# Patient Record
Sex: Female | Born: 1950 | Race: White | Hispanic: No | Marital: Married | Smoking: Former smoker
Health system: Southern US, Community
[De-identification: ages and names within clinical notes are randomized; demographics above are authoritative.]

## PROBLEM LIST (undated history)

## (undated) DIAGNOSIS — Z9889 Other specified postprocedural states: Secondary | ICD-10-CM

## (undated) DIAGNOSIS — F32A Depression, unspecified: Secondary | ICD-10-CM

## (undated) DIAGNOSIS — M1990 Unspecified osteoarthritis, unspecified site: Secondary | ICD-10-CM

## (undated) DIAGNOSIS — R112 Nausea with vomiting, unspecified: Secondary | ICD-10-CM

## (undated) DIAGNOSIS — D229 Melanocytic nevi, unspecified: Secondary | ICD-10-CM

## (undated) DIAGNOSIS — H3530 Unspecified macular degeneration: Secondary | ICD-10-CM

## (undated) DIAGNOSIS — F419 Anxiety disorder, unspecified: Secondary | ICD-10-CM

## (undated) HISTORY — DX: Melanocytic nevi, unspecified: D22.9

## (undated) HISTORY — DX: Depression, unspecified: F32.A

## (undated) HISTORY — DX: Unspecified macular degeneration: H35.30

## (undated) HISTORY — DX: Anxiety disorder, unspecified: F41.9

## (undated) HISTORY — PX: COLONOSCOPY: SHX174

## (undated) HISTORY — PX: OTHER SURGICAL HISTORY: SHX169

## (undated) HISTORY — PX: CHOLECYSTECTOMY: SHX55

---

## 1969-01-01 DIAGNOSIS — R011 Cardiac murmur, unspecified: Secondary | ICD-10-CM

## 1969-01-01 HISTORY — DX: Cardiac murmur, unspecified: R01.1

## 1999-01-02 HISTORY — PX: HIP SURGERY: SHX245

## 2001-08-22 ENCOUNTER — Ambulatory Visit (HOSPITAL_COMMUNITY): Admission: RE | Admit: 2001-08-22 | Discharge: 2001-08-22 | Payer: Self-pay | Admitting: Internal Medicine

## 2001-09-19 ENCOUNTER — Ambulatory Visit (HOSPITAL_COMMUNITY): Admission: RE | Admit: 2001-09-19 | Discharge: 2001-09-19 | Payer: Self-pay | Admitting: Internal Medicine

## 2001-09-19 ENCOUNTER — Encounter: Payer: Self-pay | Admitting: Internal Medicine

## 2008-06-15 ENCOUNTER — Other Ambulatory Visit: Admission: RE | Admit: 2008-06-15 | Discharge: 2008-06-15 | Payer: Self-pay | Admitting: Obstetrics and Gynecology

## 2009-02-16 ENCOUNTER — Ambulatory Visit: Payer: Self-pay | Admitting: Orthopedic Surgery

## 2009-02-16 DIAGNOSIS — M7580 Other shoulder lesions, unspecified shoulder: Secondary | ICD-10-CM

## 2009-02-16 DIAGNOSIS — M25519 Pain in unspecified shoulder: Secondary | ICD-10-CM

## 2009-05-17 ENCOUNTER — Ambulatory Visit (HOSPITAL_COMMUNITY): Admission: RE | Admit: 2009-05-17 | Discharge: 2009-05-17 | Payer: Self-pay | Admitting: Obstetrics and Gynecology

## 2010-01-31 NOTE — Letter (Signed)
Summary: History form  History form   Imported By: Jacklynn Ganong 02/17/2009 08:25:48  _____________________________________________________________________  External Attachment:    Type:   Image     Comment:   External Document

## 2010-01-31 NOTE — Assessment & Plan Note (Signed)
Summary: LEFT SHOULDER PAIN NEEDS XR/AETNA/FAGAN/BSF   Vital Signs:  Patient profile:   60 year old female Weight:      150 pounds Pulse rate:   78 / minute Resp:     16 per minute  Vitals Entered By: Fuller Canada MD (February 16, 2009 10:41 AM)  Visit Type:  Initial Consult Referring Provider:  Dr. Ouida Sills Primary Provider:  Dr. Ouida Sills  CC:  left shoulder pain.  History of Present Illness: I saw Hannah Contreras in the office today for an initial visit.  She is a 60 years old woman with the complaint of:  left shoulder pain some neck pain also.  No major injury was noted but increasing pain in the LEFT shoulder difficulty sleeping on the LEFT side associated with certain movements with the arm away from the body causes increased pain.  Pain has now progressed to include neck pain with some neck stiffness and the patient wanted this checked out.  She is averse to taking injection or medication if not needed  She did try some Motrin without much relief aircraft    Will have xrays today in our office.  Motrin for pain not much relief.    Allergies (verified): 1)  ! Pcn  Past History:  Past Medical History: anxiety  Past Surgical History: gallbladder 2 c sections OTIF right hip Dr Romeo Apple   Family History: Family History of Diabetes Family History Coronary Heart Disease female < 23 Family History of Arthritis  Social History: Patient is married.  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 Cardiac :  Denies chest pain, angina, heart attack, heart failure, poor circulation, blood clots, and phlebitis; murmur. Resp:  Denies short of breath, difficulty breathing, COPD, cough, and pneumonia. GI:  Denies nausea, vomiting, diarrhea, constipation, difficulty swallowing, ulcers, GERD, and reflux. GU:  Denies  kidney failure, kidney transplant, kidney stones, burning, poor stream, testicular cancer, blood in urine, and . Neuro:  Denies headache, dizziness, migraines, numbness, weakness, tremor, and unsteady walking. MS:  Denies joint pain, rheumatoid arthritis, joint swelling, gout, bone cancer, osteoporosis, and ; muscle pain. Endo:  Denies thyroid disease, goiter, and diabetes. Psych:  Complains of anxiety; denies depression, mood swings, panic attack, bipolar, and schizophrenia. Derm:  Denies eczema, cancer, and itching. EENT:  Denies poor vision, cataracts, glaucoma, poor hearing, vertigo, ears ringing, sinusitis, hoarseness, toothaches, and bleeding gums. Immunology:  Denies seasonal allergies, sinus problems, and allergic to bee stings. Lymphatic:  Denies lymph node cancer and lymph edema.  Physical Exam  Additional Exam:  GEN: well developed and nourished. normal body habitus, grooming and hygiene  CDV: normal pulses perfusion color and temperature without swelling or edema  Lymph: normal lymph system  SKIN: normal without lesions, masses, nodules  NEURO/PSYCH: Normal coordination, reflexes and sensation. awake alert and oriented   cervical spine inspection was normal except for tenderness on the LEFT side of the cervical spine at the base of the cervical spine she did have decreased range of motion with rotation to the LEFT and lateral bend to the RIGHT with increased pain along the side of the neck.  No laxity was noted and muscle tone was normal  Skin over the neck was normal as was the upper back and trunk  RIGHT shoulder inspection was normal there was no tenderness.  Her range  of motion is full.  There is no joint laxity and she has no atrophy or tremor in her muscle strength and tone were normal  LEFT shoulder she had really good flexion external rotation internal rotation to T4 but she does have some pain with internal rotation of the arm in forward elevation and her impingement  sign while normal with a Neer maneuver is positive with Hawkins maneuver.  Should no instability or it muscle tone was normal no tremor.  No joint laxity.  Skin normal in both shoulders.     Impression & Recommendations:  Problem # 1:  SHOULDER PAIN (ICD-719.41) Assessment New  2 views were done of the LEFT shoulder  2 views of the glenohumeral joint   The glenohumeral joint space is normal. The bony anatomy is without bone lesion. The acromion is a Type II  Orders: Consultation Level III (16109) Shoulder x-ray,  minimum 2 views (60454)  Problem # 2:  IMPINGEMENT SYNDROME (ICD-726.2) Assessment: New  Orders: Consultation Level III (09811) Shoulder x-ray,  minimum 2 views (91478)  Patient Instructions: 1)  Limit activity to comfort and avoid activities that increase discomfort.  Apply moist heat and/or ice to shoulder and take medication as instructed for pain relief. Please read the Shoulder Pain Handout and start exercises   2)  Please schedule a follow-up appointment as needed.

## 2010-05-19 NOTE — Op Note (Signed)
   NAME:  Hannah Contreras, Hannah Contreras                        ACCOUNT NO.:  [id number]   MEDICAL RECORD NO.:  [id number]                   PATIENT TYPE:  AMB   LOCATION:  DAY                                  FACILITY:  [facility]   PHYSICIAN:  Gerrit Friends. Rourk, M.D.               DATE OF BIRTH:  Dec 27, 1950   DATE OF PROCEDURE:  08/22/2001  DATE OF DISCHARGE:                                 OPERATIVE REPORT   PROCEDURE:  Screening colonoscopy.   ENDOSCOPIST:  Gerrit Friends. Rourk, M.D.   INDICATIONS FOR PROCEDURE:  The patient is a 60 year old lady referred for  colorectal cancer screening by Dr. Ouida Sills.  She is devoid of any lower GI  tract symptoms.  No family history of colorectal neoplasia.  She has never  had her lower GI tract imaged.  Colonoscopy is now being done as a standard  screening maneuver.   Dictation ended at this point.                                               Gerrit Friends. Rourk, M.D.    RMR/MEDQ  D:  08/22/2001  T:  08/23/2001  Job:  [id number]

## 2010-05-19 NOTE — Op Note (Signed)
   NAME:  Hannah Contreras, Hannah Contreras                        ACCOUNT NO.:  [id number]   MEDICAL RECORD NO.:  [id number]                   PATIENT TYPE:  AMB   LOCATION:  DAY                                  FACILITY:  [facility]   PHYSICIAN:  Gerrit Friends. Rourk, M.D.               DATE OF BIRTH:  09-09-1950   DATE OF PROCEDURE:  08/22/2001  DATE OF DISCHARGE:                                 OPERATIVE REPORT   PROCEDURE:  Screening colonoscopy.   ENDOSCOPIST:  Gerrit Friends. Rourk, M.D.   INDICATIONS FOR PROCEDURE:  The patient is a 60 year old lady with no GI  symptoms. She is referred at the request of Dr. Carylon Perches for screening  colonoscopy.  She has never had her lower GI tract imaged.  There is no  family history of colorectal neoplasia.  Colonoscopy is now being done as a  standard screening maneuver.  This approach has been discussed with the  patient at length at the bedside.  The potential risks, benefits, and  alternatives have been reviewed, questions answered.  I feel that she is low  risk for conscious sedation in the way of Versed and Demerol.   DESCRIPTION OF PROCEDURE:  The patient is placed in the left lateral  decubitus position.  O2 saturation, blood pressure, pulse and respirations  were monitored throughout the entire procedure.   CONSCIOUS SEDATION:  Versed 6 mg IV, Fentanyl 125 mcg IV in divided doses.   INSTRUMENT:  Olympus video chip adult colonoscope.   FINDINGS:  Digital rectal exam revealed no abnormalities.   ENDOSCOPIC FINDINGS:  The prep was good.   RECTUM:  Examination of the rectal mucosa including the retroflex view of  the anal verge revealed no abnormalities.   COLON:  The colonic mucosa was surveyed from the rectosigmoid junction  through the left transverse and right colon to the area of the appendiceal  orifice, ileocecal valve, and cecum.  These structures were well seen and  photographed for the record.  The colonic mucosa all the way to the cecum  appeared  normal.   From the level of the cecum and ileocecal valve the scope was slowly  withdrawn; and all previously mentioned mucosal surfaces were one again  seen; and, again, no abnormalities were observed.  The patient tolerated the  procedure well and was reacted in endoscopy.   IMPRESSION:  1. Normal rectum.  2. Normal colon.    RECOMMENDATIONS:  1. Follow up with Dr. Ouida Sills.  2. Repeat colonoscopy in 10 years.                                               Gerrit Friends. Rourk, M.D.    RMR/MEDQ  D:  08/22/2001  T:  08/23/2001  Job:  [id number]

## 2011-04-24 ENCOUNTER — Other Ambulatory Visit: Payer: Self-pay | Admitting: Obstetrics and Gynecology

## 2011-04-24 ENCOUNTER — Other Ambulatory Visit (HOSPITAL_COMMUNITY)
Admission: RE | Admit: 2011-04-24 | Discharge: 2011-04-24 | Disposition: A | Discharge status: Home / Self Care | Payer: BC Managed Care – PPO | Source: Ambulatory Visit | Attending: Obstetrics and Gynecology | Admitting: Obstetrics and Gynecology

## 2011-04-24 DIAGNOSIS — Z01419 Encounter for gynecological examination (general) (routine) without abnormal findings: Secondary | ICD-10-CM | POA: Insufficient documentation

## 2012-04-25 ENCOUNTER — Other Ambulatory Visit: Payer: Self-pay | Admitting: Obstetrics and Gynecology

## 2012-04-25 DIAGNOSIS — Z139 Encounter for screening, unspecified: Secondary | ICD-10-CM

## 2012-05-19 ENCOUNTER — Ambulatory Visit (HOSPITAL_COMMUNITY)
Admission: RE | Admit: 2012-05-19 | Discharge: 2012-05-19 | Disposition: A | Discharge status: Home / Self Care | Payer: BC Managed Care – PPO | Source: Ambulatory Visit | Attending: Obstetrics and Gynecology | Admitting: Obstetrics and Gynecology

## 2012-05-19 DIAGNOSIS — Z1231 Encounter for screening mammogram for malignant neoplasm of breast: Secondary | ICD-10-CM | POA: Insufficient documentation

## 2012-05-19 DIAGNOSIS — Z139 Encounter for screening, unspecified: Secondary | ICD-10-CM

## 2013-06-29 ENCOUNTER — Other Ambulatory Visit: Payer: Self-pay | Admitting: Obstetrics and Gynecology

## 2013-06-29 DIAGNOSIS — Z1231 Encounter for screening mammogram for malignant neoplasm of breast: Secondary | ICD-10-CM

## 2013-07-06 ENCOUNTER — Ambulatory Visit (HOSPITAL_COMMUNITY)
Admission: RE | Admit: 2013-07-06 | Discharge: 2013-07-06 | Disposition: A | Discharge status: Home / Self Care | Payer: BC Managed Care – PPO | Source: Ambulatory Visit | Attending: Obstetrics and Gynecology | Admitting: Obstetrics and Gynecology

## 2013-07-06 DIAGNOSIS — Z1231 Encounter for screening mammogram for malignant neoplasm of breast: Secondary | ICD-10-CM | POA: Insufficient documentation

## 2013-07-31 ENCOUNTER — Emergency Department (HOSPITAL_COMMUNITY)
Admission: EM | Admit: 2013-07-31 | Discharge: 2013-07-31 | Disposition: A | Discharge status: Home / Self Care | Payer: BC Managed Care – PPO | Attending: Emergency Medicine | Admitting: Emergency Medicine

## 2013-07-31 ENCOUNTER — Encounter (HOSPITAL_COMMUNITY): Payer: Self-pay | Admitting: Emergency Medicine

## 2013-07-31 ENCOUNTER — Emergency Department (HOSPITAL_COMMUNITY): Payer: BC Managed Care – PPO

## 2013-07-31 DIAGNOSIS — Y9389 Activity, other specified: Secondary | ICD-10-CM | POA: Insufficient documentation

## 2013-07-31 DIAGNOSIS — S42213A Unspecified displaced fracture of surgical neck of unspecified humerus, initial encounter for closed fracture: Secondary | ICD-10-CM | POA: Insufficient documentation

## 2013-07-31 DIAGNOSIS — Y929 Unspecified place or not applicable: Secondary | ICD-10-CM | POA: Insufficient documentation

## 2013-07-31 DIAGNOSIS — S42212A Unspecified displaced fracture of surgical neck of left humerus, initial encounter for closed fracture: Secondary | ICD-10-CM

## 2013-07-31 DIAGNOSIS — Z880 Allergy status to penicillin: Secondary | ICD-10-CM | POA: Insufficient documentation

## 2013-07-31 DIAGNOSIS — R296 Repeated falls: Secondary | ICD-10-CM | POA: Insufficient documentation

## 2013-07-31 DIAGNOSIS — S6990XA Unspecified injury of unspecified wrist, hand and finger(s), initial encounter: Secondary | ICD-10-CM | POA: Insufficient documentation

## 2013-07-31 MED ORDER — OXYCODONE-ACETAMINOPHEN 5-325 MG PO TABS
1.0000 | ORAL_TABLET | Freq: Once | ORAL | Status: AC
  Administered 2013-07-31: 1 via ORAL
  Filled 2013-07-31: qty 1

## 2013-07-31 MED ORDER — OXYCODONE-ACETAMINOPHEN 5-325 MG PO TABS: ORAL_TABLET | ORAL | Status: DC

## 2013-07-31 MED ORDER — DICLOFENAC SODIUM 75 MG PO TBEC: 75.0000 mg | DELAYED_RELEASE_TABLET | Freq: Two times a day (BID) | ORAL | Status: DC

## 2013-07-31 NOTE — ED Notes (Signed)
Fell this morning.  Tangled up in dog leash.  C/o pain to left upper arm/shoulder.    Abrasions to both knees.

## 2013-07-31 NOTE — Discharge Instructions (Signed)
Your x-ray reveals a fracture of the left humerus. Please apply ice, and use the shoulder immobilizer until seen by Dr. Aline Brochure. Please call Dr. Aline Brochure on Monday to arrange an office appointment. Please use diclofenac 2 times daily with food until all taken. May use Percocet for pain if needed. This medication may cause drowsiness, and/or constipation. Please use with caution. Humerus Fracture, Treated with Immobilization The humerus is the large bone in the upper arm. A broken (fractured) humerus is often treated by wearing a cast, splint, or sling (immobilization). This holds the broken pieces in place so they can heal.  HOME CARE  Put ice on the injured area.  Put ice in a plastic bag.  Place a towel between your skin and the bag.  Leave the ice on for 15-20 minutes, 03-04 times a day.  If you are given a cast:  Do not scratch the skin under the cast.  Check the skin around the cast every day. You may put lotion on any red or sore areas.  Keep the cast dry and clean.  If you are given a splint:  Wear the splint as told.  Keep the splint clean and dry.  Loosen the elastic around the splint if your fingers become numb, cold, tingle, or turn blue.  If you are given a sling:  Wear the sling as told.  Do not put pressure on any part of the cast or splint until it is fully hardened.  The cast or splint must be protected with a plastic bag during bathing. Do not lower the cast or splint into water.  Only take medicine as told by your doctor.  Do exercises as told by your doctor.  Follow up as told by your doctor. GET HELP RIGHT AWAY IF:   Your skin or fingernails turn blue or gray.  Your arm feels cold or numb.  You have very bad pain in the injured arm.  You are having problems with the medicines you were given. MAKE SURE YOU:   Understand these instructions.  Will watch your condition.  Will get help right away if you are not doing well or get  worse. Document Released: 06/06/2007 Document Revised: 03/12/2011 Document Reviewed: 02/01/2010 [facility] Patient Information 2015 [place], [place]. This information is not intended to replace advice given to you by your health care provider. Make sure you discuss any questions you have with your health care provider.

## 2013-07-31 NOTE — ED Provider Notes (Signed)
CSN: [id number]     Arrival date & time 07/31/13  1103 History   First MD Initiated Contact with Patient 07/31/13 1106     Chief Complaint  Patient presents with  . Fall     (Consider location/radiation/quality/duration/timing/severity/associated sxs/prior Treatment) Patient is a 63 y.o. female presenting with fall. The history is provided by the patient.  Fall This is a new problem. The current episode started today. The problem has been gradually worsening. Associated symptoms include arthralgias. Pertinent negatives include no abdominal pain, chest pain, coughing, fever, nausea, neck pain or vomiting. Exacerbated by: movement of the left shoulder. She has tried nothing for the symptoms. The treatment provided no relief.    History reviewed. No pertinent past medical history. Past Surgical History  Procedure Laterality Date  . Cesarean section    . Cholecystectomy    . Other surgical history      history of multiple fractures.  right hip., left arm and elbow, right arm.    History reviewed. No pertinent family history. History  Substance Use Topics  . Smoking status: Never Smoker   . Smokeless tobacco: Not on file  . Alcohol Use: No   OB History   Grav Para Term Preterm Abortions TAB SAB Ect Mult Living                 Review of Systems  Constitutional: Negative for fever and activity change.       All ROS Neg except as noted in HPI  Eyes: Negative for photophobia and discharge.  Respiratory: Negative for cough, shortness of breath and wheezing.   Cardiovascular: Negative for chest pain and palpitations.  Gastrointestinal: Negative for nausea, vomiting, abdominal pain and blood in stool.  Genitourinary: Negative for dysuria, frequency and hematuria.  Musculoskeletal: Positive for arthralgias. Negative for back pain and neck pain.  Skin: Negative.   Neurological: Negative for dizziness, seizures and speech difficulty.  Psychiatric/Behavioral: Negative for hallucinations  and confusion.      Allergies  Codeine and Penicillins  Home Medications   Prior to Admission medications   Medication Sig Start Date End Date Taking? Authorizing Provider  Dietary Management Product (TOZAL PO) Take 3 tablets by mouth daily.   Yes Historical Provider, MD   BP 118/62  Pulse 75  Temp(Src) 98.1 F (36.7 C) (Oral)  Resp 20  SpO2 99% Physical Exam  Nursing note and vitals reviewed. Constitutional: She is oriented to person, place, and time. She appears well-developed and well-nourished.  Non-toxic appearance.  HENT:  Head: Normocephalic.  Right Ear: Tympanic membrane and external ear normal.  Left Ear: Tympanic membrane and external ear normal.  Eyes: EOM and lids are normal. Pupils are equal, round, and reactive to light.  Neck: Normal range of motion. Neck supple. Carotid bruit is not present.  Cardiovascular: Normal rate, regular rhythm, normal heart sounds, intact distal pulses and normal pulses.  Exam reveals no gallop and no friction rub.   No murmur heard. Pulmonary/Chest: Breath sounds normal. No respiratory distress. She has no wheezes. She has no rales. She exhibits no tenderness.  Abdominal: Soft. Bowel sounds are normal. There is no tenderness. There is no guarding.  Musculoskeletal: Normal range of motion.  Capillary refill on the left is less than 2 seconds. The radial pulse is 2+. The brachial pulse is 2+. There is no deformity or swelling involving the elbow. There is no pain to palpation of the biceps triceps area. There is pain with attempted raising of the left  arm. There is no evidence for dislocation. There is no deformity of the scapula or clavical.  Lymphadenopathy:       Head (right side): No submandibular adenopathy present.       Head (left side): No submandibular adenopathy present.    She has no cervical adenopathy.  Neurological: She is alert and oriented to person, place, and time. She has normal strength. No cranial nerve deficit or  sensory deficit.  Grip is symmetrical. No sensory deficits of the upper extremities appreciated.  Skin: Skin is warm and dry.  Psychiatric: She has a normal mood and affect. Her speech is normal.    ED Course  Procedures (including critical care time) Labs Review Labs Reviewed - No data to display  Imaging Review No results found.   EKG Interpretation None      MDM X-ray of the left shoulder is negative for fracture or dislocation. 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  X-ray of the left humerus reveals a mildly impacted fracture of the neck of the left humerus. The shaft of the humerus is intact. No elbow abnormality.  The patient has been made aware of the findings on the x-rays. The patient has been fitted with a shoulder immobilizer, ice pack, and pain medications. Call placed to Dr. Aline Brochure at the patient's request. Dr. Aline Brochure as not on call today. Patient will notify the physician on Monday, and arrange office evaluation. Prescription for Percocet one every 6 hours given to the patient.    Final diagnoses:  None    *I have reviewed nursing notes, vital signs, and all appropriate lab and imaging results for this patient.Lenox Ahr, PA-C 08/01/13 0930

## 2013-08-01 NOTE — ED Provider Notes (Signed)
Medical screening examination/treatment/procedure(s) were performed by non-physician practitioner and as supervising physician I was immediately available for consultation/collaboration.   EKG Interpretation None       Orlie Dakin, MD 08/01/13 1735

## 2013-08-04 ENCOUNTER — Ambulatory Visit (INDEPENDENT_AMBULATORY_CARE_PROVIDER_SITE_OTHER): Payer: BC Managed Care – PPO | Admitting: Orthopedic Surgery

## 2013-08-04 ENCOUNTER — Encounter: Payer: Self-pay | Admitting: Orthopedic Surgery

## 2013-08-04 VITALS — BP 103/60 | Ht 64.0 in | Wt 160.0 lb

## 2013-08-04 DIAGNOSIS — S42209A Unspecified fracture of upper end of unspecified humerus, initial encounter for closed fracture: Secondary | ICD-10-CM

## 2013-08-04 DIAGNOSIS — S42202A Unspecified fracture of upper end of left humerus, initial encounter for closed fracture: Secondary | ICD-10-CM

## 2013-08-04 NOTE — Patient Instructions (Addendum)
Sling wear 2 weeks   Return for xrays   Humerus Fracture, Treated with Immobilization The humerus is the large bone in your upper arm. You have a broken (fractured) humerus. These fractures are easily diagnosed with X-rays. TREATMENT  Simple fractures which will heal without disability are treated with simple immobilization. Immobilization means you will wear a cast, splint, or sling. You have a fracture which will do well with immobilization. The fracture will heal well simply by being held in a good position until it is stable enough to begin range of motion exercises. Do not take part in activities which would further injure your arm.  HOME CARE INSTRUCTIONS   Put ice on the injured area.  Put ice in a plastic bag.  Place a towel between your skin and the bag.  Leave the ice on for 15-20 minutes, 03-04 times a day.  If you have a cast:  Do not scratch the skin under the cast using sharp or pointed objects.  Check the skin around the cast every day. You may put lotion on any red or sore areas.  Keep your cast dry and clean.  If you have a splint:  Wear the splint as directed.  Keep your splint dry and clean.  You may loosen the elastic around the splint if your fingers become numb, tingle, or turn cold or blue.  If you have a sling:  Wear the sling as directed.  Do not put pressure on any part of your cast or splint until it is fully hardened.  Your cast or splint can be protected during bathing with a plastic bag. Do not lower the cast or splint into water.  Only take over-the-counter or prescription medicines for pain, discomfort, or fever as directed by your caregiver.  Do range of motion exercises as instructed by your caregiver.  Follow up as directed by your caregiver. This is very important in order to avoid permanent injury or disability and chronic pain. SEEK IMMEDIATE MEDICAL CARE IF:   Your skin or nails in the injured arm turn blue or gray.  Your arm  feels cold or numb.  You develop severe pain in the injured arm.  You are having problems with the medicines you were given. MAKE SURE YOU:   Understand these instructions.  Will watch your condition.  Will get help right away if you are not doing well or get worse. Document Released: 03/26/2000 Document Revised: 03/12/2011 Document Reviewed: 02/01/2010 [facility] Patient Information 2015 [place], [place]. This information is not intended to replace advice given to you by your health care provider. Make sure you discuss any questions you have with your health care provider.

## 2013-08-04 NOTE — Progress Notes (Signed)
The patient  Hannah Contreras  63 year old female who I placed cannulated screws in her hip for femoral neck Hannah Contreras plan 1 presents after falling after becoming entangled in the dog leash on July 31 she injured her proximal humerus she sustained a proximal humerus Hannah Contreras she presents with pain and swelling which is best described as sharp burning and stabbing with mild radiation. She is on diclofenac oxycodone but she's not taking the oxycodone because it makes her feel funny she. She was placed in an immobilizer and doing well with that  The past, family history and social history have been reviewed and are recorded in the corresponding sections of epic   Review of systems has been recorded reviewed and signed and scanned into the chart   Vital signs are stable as recorded  General appearance is normal, body habitus normal  The patient is alert and oriented x 3  The patient's mood and affect are normal  Gait assessment: Normal  The cardiovascular exam reveals normal pulses and temperature without edema or  swelling.  The lymphatic system is negative for palpable lymph nodes  The sensory exam is normal.  There are no pathologic reflexes.  Balance is normal.   Exam of the left upper extremity  Inspection ecchymosis in the brachial area and tenderness in the proximal humerus Range of motion could not assess because of pain and Hannah Contreras Stability could not assess because of pain Strength normal muscle tone Skin normal, no rash, or laceration. Provocative tests none necessary  A/P Left proximal humerus Hannah Contreras nondisplaced hospital x-rays were excellent  Recommend shoulder immobilizer for a period of up to 14 days then repeat x-ray and start therapy after.  August 13 followup x-rays left shoulder

## 2013-08-13 ENCOUNTER — Ambulatory Visit (INDEPENDENT_AMBULATORY_CARE_PROVIDER_SITE_OTHER): Payer: Self-pay | Admitting: Orthopedic Surgery

## 2013-08-13 ENCOUNTER — Ambulatory Visit (INDEPENDENT_AMBULATORY_CARE_PROVIDER_SITE_OTHER): Payer: BC Managed Care – PPO

## 2013-08-13 VITALS — BP 108/66 | Ht 64.0 in | Wt 160.0 lb

## 2013-08-13 DIAGNOSIS — S4292XD Fracture of left shoulder girdle, part unspecified, subsequent encounter for fracture with routine healing: Secondary | ICD-10-CM

## 2013-08-13 DIAGNOSIS — S42309D Unspecified fracture of shaft of humerus, unspecified arm, subsequent encounter for fracture with routine healing: Secondary | ICD-10-CM

## 2013-08-13 NOTE — Progress Notes (Signed)
Chief Complaint  Patient presents with  . Follow-up    10 day recheck and xray Left shoulder, DOI 07/31/13   Proximal humerus fracture left shoulder post 2 weeks x-ray shows no change in position of the stable fracture she is allowed to start physical therapy continue sling for 2 more weeks  Followup 6-8 weeks for a recheck for her range of motion  Encounter Diagnosis  Name Primary?  . Shoulder fracture, left, with routine healing, subsequent encounter Yes

## 2013-08-13 NOTE — Patient Instructions (Signed)
Call to arrange therapy 

## 2013-08-27 ENCOUNTER — Ambulatory Visit (HOSPITAL_COMMUNITY)
Admission: RE | Admit: 2013-08-27 | Discharge: 2013-08-27 | Disposition: A | Discharge status: Home / Self Care | Payer: BC Managed Care – PPO | Source: Ambulatory Visit | Attending: Orthopedic Surgery | Admitting: Orthopedic Surgery

## 2013-08-27 DIAGNOSIS — IMO0001 Reserved for inherently not codable concepts without codable children: Secondary | ICD-10-CM | POA: Diagnosis not present

## 2013-08-27 DIAGNOSIS — M6281 Muscle weakness (generalized): Secondary | ICD-10-CM

## 2013-08-27 DIAGNOSIS — M25619 Stiffness of unspecified shoulder, not elsewhere classified: Secondary | ICD-10-CM

## 2013-08-27 DIAGNOSIS — M259 Joint disorder, unspecified: Secondary | ICD-10-CM | POA: Insufficient documentation

## 2013-08-27 NOTE — Evaluation (Signed)
Occupational Therapy Evaluation  Patient Details  Name: Hannah Contreras MRN: [id number] Date of Birth: 23-May-1950  Today's Date: 08/27/2013 Time: 0998-3382 OT Time Calculation (min): 30 min OT eval 5053-9767 18' [place] 3419-3790 12'  Visit#: 1 of 16  Re-eval: 09/24/13  Assessment Diagnosis: Left proximal humerus fracture Next MD Visit: 09/24/13 - Aline Brochure    Past Medical History: No past medical history on file. Past Surgical History:  Past Surgical History  Procedure Laterality Date  . Cesarean section    . Cholecystectomy    . Other surgical history      history of multiple fractures.  right hip., left arm and elbow, right arm.   . Hip surgery Right 2001    Subjective Symptoms/Limitations Symptoms: S: I'm scared to move my arm because when I was in a sling for 2 weeks I was told that if the bone shifted and wasn't healing right I would need surgery. Pertinent History: 07/31/13 patient became tangled up in daughter's dog's leash and fell down resulting in a left proximal humerus fx. No surgery was completed. Patient is to wear sling for 2 more weeks although she presents to OT evaluation without it. Pt states that she does not like to wear it even though she knows she's suppose to. Dr. Aline Brochure has referred patient to occupational therapy for evaluation and treatment. Limitations: Reaching up high, lifting heavy items Special Tests: FOTO score: 52/100 Patient Stated Goals: To heal quickly and be able to lift heavier things than a glass of water. Pain Assessment Currently in Pain?: No/denies (dull ache when she does a lot of activities with right arm)  Precautions/Restrictions  Precautions Precautions: Shoulder (progress at tolerated)  Balance Screening Balance Screen Has the patient fallen in the past 6 months: Yes How many times?: 1 Has the patient had a decrease in activity level because of a fear of falling? : Yes Is the patient reluctant to leave their home because  of a fear of falling? : No  Prior [place] expects to be discharged to:: Private residence Prior Function Level of Independence: Independent with basic ADLs;Independent with gait  Able to Take Stairs?: Yes Driving: Yes Vocation: Retired Leisure: Hobbies-yes (Comment) Comments: has 4 grandchildren, travel, she's always on the go  Assessment ADL/Vision/Perception ADL ADL Comments: Difficulty reaching up high, lifting heavy items, using left arm as dominant extremity, getting dressed Dominant Hand: Left Vision - History Baseline Vision: No visual deficits  Cognition/Observation Cognition Overall Cognitive Status: Within Functional Limits for tasks assessed Arousal/Alertness: Awake/alert Orientation Level: Oriented X4  Sensation/Coordination/Edema Edema Edema: Mild edema in left elbow to upper arm region.  Additional Assessments RUE Strength RUE Overall Strength:  (Not tested this date) LUE Assessment LUE Assessment:  (assessed supine. IR/ER Adducted) LUE AROM (degrees) Left Shoulder Flexion: 130 Degrees Left Shoulder ABduction: 86 Degrees Left Shoulder Internal Rotation: 90 Degrees Left Shoulder External Rotation: 60 Degrees Palpation Palpation: Mod fascial restrictions in left elbow, upper arm, trapezius, and scapularis region.     Exercise/Treatments     Manual Therapy Manual Therapy: Myofascial release Edema Management: Edema massage to left elbow to upper arm region to decrease edema and pain and increase joint mobility. Myofascial Release: Myofascial release to left elbow, upper arm, trapezius and scapularis region to decrease fascial restriction and increase joint mobility in a pain free zone.  Occupational Therapy Assessment and Plan OT Assessment and Plan Clinical Impression Statement: A: Patient is a 63 y/o female s/p left proximal humerus fx  resulting in decreased strength and joint mobility and increased pain and fascial  restrictions causing difficulty completing ADL and leisure tasks.  Pt will benefit from skilled therapeutic intervention in order to improve on the following deficits: Pain;Increased fascial restricitons;Decreased strength;Increased edema;Decreased range of motion Rehab Potential: Excellent OT Frequency: Min 2X/week OT Duration: 8 weeks OT Treatment/Interventions: Self-care/ADL training;Modalities;Therapeutic exercise;Therapeutic activities;Manual therapy;Patient/family education OT Plan: P: Pt will benefit from skilled OT interventions in order to decrease pain, increase ROM, increase strength, and increase overall LUE functional use. Treatment Plan: PROM, AAROM, and AROM, MFR and manual stretching, scapular strengtheing and proximal stabilization, LUE general strengthening    Goals Short Term Goals Time to Complete Short Term Goals: 4 weeks Short Term Goal 1: Patient will be educated on HEP. Short Term Goal 2: Patient will increase AROM to [facility] to increase ability to donn shirt as she did prior to fracture and with less difficulty Short Term Goal 3: Patient will increase shoulder strength to 3+/5 to increase ability to reach into overhead cabinets with less difficulty. Short Term Goal 4: Patient will decrease fascial restrictions from mod to min amount.  Long Term Goals Time to Complete Long Term Goals: 8 weeks Long Term Goal 1: Patient will return to highest level of independence with all BADL and leisure tasks. Long Term Goal 2: Patient will increase AROM to WNL to increase ability to donn shirt as she did prior to fracture and with less difficulty. Long Term Goal 3: Patient will increase shoulder strength to 4/5 to increase ability to lift heavy items with less difficulty Long Term Goal 4: Patient will report not pain or discomfort in left shoulder while completing daily tasks.  Long Term Goal 5: Patient will decrease fascial restrictions from min to zero amount.   Problem List Patient  Active Problem List   Diagnosis Date Noted  . Muscle weakness (generalized) 08/27/2013  . Decreased range of motion (ROM) of shoulder 08/27/2013  . Fracture of proximal end of left humerus 08/04/2013  . SHOULDER PAIN 02/16/2009  . IMPINGEMENT SYNDROME 02/16/2009    End of Session Activity Tolerance: Patient tolerated treatment well General Behavior During Therapy: [facility] for tasks assessed/performed OT Plan of Care OT Home Exercise Plan: towel slides OT Patient Instructions: handout (scanned) Consulted and Agree with Plan of Care: Patient   Ailene Ravel, OTR/L,CBIS   08/27/2013, 3:49 PM  Physician Documentation Your signature is required to indicate approval of the treatment plan as stated above.  Please sign and either send electronically or make a copy of this report for your files and return this physician signed original.  Please mark one 1.__approve of plan  2. ___approve of plan with the following conditions.   ______________________________                                                          _____________________ Physician Signature  Date  

## 2013-08-28 ENCOUNTER — Telehealth (HOSPITAL_COMMUNITY): Payer: Self-pay

## 2013-08-31 ENCOUNTER — Ambulatory Visit (HOSPITAL_COMMUNITY): Payer: BC Managed Care – PPO

## 2013-09-08 ENCOUNTER — Ambulatory Visit (HOSPITAL_COMMUNITY)
Admission: RE | Admit: 2013-09-08 | Discharge: 2013-09-08 | Disposition: A | Discharge status: Home / Self Care | Payer: BC Managed Care – PPO | Source: Ambulatory Visit | Attending: Orthopedic Surgery | Admitting: Orthopedic Surgery

## 2013-09-08 DIAGNOSIS — IMO0001 Reserved for inherently not codable concepts without codable children: Secondary | ICD-10-CM | POA: Insufficient documentation

## 2013-09-08 DIAGNOSIS — M6281 Muscle weakness (generalized): Secondary | ICD-10-CM | POA: Diagnosis not present

## 2013-09-08 DIAGNOSIS — M259 Joint disorder, unspecified: Secondary | ICD-10-CM | POA: Diagnosis present

## 2013-09-08 NOTE — Progress Notes (Signed)
Occupational Therapy Treatment Patient Details  Name: Hannah Contreras MRN: [id number] Date of Birth: 23-Sep-1950  Today's Date: 09/08/2013 Time: 4259-5638 OT Time Calculation (min): 42 min Manual therapy [phone] 27' Therapeutic exercises 1002-1017 15'  Visit#: 2 of 16  Re-eval: 09/24/13    Authorization:    Authorization Time Period:    Authorization Visit#:   of    Subjective S:  I have felt a feeling like a rubberband going over a tennis ball near the fracture sight a few times this weekend.  Limitations: Reaching up high, lifting heavy items Pain Assessment Currently in Pain?: Yes Pain Score: 3  Pain Location: Shoulder Pain Orientation: Left Pain Type: Acute pain Pain Onset: 1 to 4 weeks ago Pain Frequency: Occasional  Precautions/Restrictions   progress as tolerated  Exercise/Treatments Supine Protraction: PROM;10 reps Horizontal ABduction: PROM;10 reps External Rotation: PROM;10 reps Internal Rotation: PROM;10 reps Flexion: PROM;10 reps ABduction: PROM;10 reps Seated Elevation: AROM;10 reps Extension: AROM;10 reps Row: AROM;10 reps Therapy Ball Flexion: 10 reps ABduction: 10 reps  Isometric Strengthening   Flexion: Supine;3X3" Extension: Supine;3X3" External Rotation: Supine;3X3" Internal Rotation: Supine;3X3" ABduction: Supine;3X3" ADduction: Supine;3X3"    Manual Therapy Manual Therapy: Myofascial release Myofascial Release: Myofascial release to left elbow, upper arm, trapezius and scapularis region to decrease fascial restriction and increase joint mobility in a pain free zone.  Occupational Therapy Assessment and Plan OT Assessment and Plan Clinical Impression Statement: A:  PROM at 3/4 range in supine this date.   OT Plan: P:  Increase PROM to [facility] in supine, increase contraction time of isometrics to 5".   Goals Short Term Goals Time to Complete Short Term Goals: 4 weeks Short Term Goal 1: Patient will be educated on HEP. Short Term Goal  1 Progress: Progressing toward goal Short Term Goal 2: Patient will increase AROM to [facility] to increase ability to donn shirt as she did prior to fracture and with less difficulty Short Term Goal 2 Progress: Progressing toward goal Short Term Goal 3: Patient will increase shoulder strength to 3+/5 to increase ability to reach into overhead cabinets with less difficulty. Short Term Goal 3 Progress: Progressing toward goal Short Term Goal 4: Patient will decrease fascial restrictions from mod to min amount.  Short Term Goal 4 Progress: Progressing toward goal Long Term Goals Time to Complete Long Term Goals: 8 weeks Long Term Goal 1: Patient will return to highest level of independence with all BADL and leisure tasks. Long Term Goal 1 Progress: Progressing toward goal Long Term Goal 2: Patient will increase AROM to WNL to increase ability to donn shirt as she did prior to fracture and with less difficulty. Long Term Goal 2 Progress: Progressing toward goal Long Term Goal 3: Patient will increase shoulder strength to 4/5 to increase ability to lift heavy items with less difficulty Long Term Goal 3 Progress: Progressing toward goal Long Term Goal 4: Patient will report not pain or discomfort in left shoulder while completing daily tasks.  Long Term Goal 4 Progress: Progressing toward goal Long Term Goal 5: Patient will decrease fascial restrictions from min to zero amount.  Long Term Goal 5 Progress: Progressing toward goal  Problem List Patient Active Problem List   Diagnosis Date Noted  . Muscle weakness (generalized) 08/27/2013  . Decreased range of motion (ROM) of shoulder 08/27/2013  . Fracture of proximal end of left humerus 08/04/2013  . SHOULDER PAIN 02/16/2009  . IMPINGEMENT SYNDROME 02/16/2009    End of Session Activity Tolerance:  Patient tolerated treatment well General Behavior During Therapy: [facility] for tasks assessed/performed  [place],  OTR/L [phone]  09/08/2013, 10:33 AM

## 2013-09-10 ENCOUNTER — Ambulatory Visit (HOSPITAL_COMMUNITY)
Admission: RE | Admit: 2013-09-10 | Discharge: 2013-09-10 | Disposition: A | Discharge status: Home / Self Care | Payer: BC Managed Care – PPO | Source: Ambulatory Visit | Attending: Internal Medicine | Admitting: Internal Medicine

## 2013-09-10 ENCOUNTER — Ambulatory Visit (HOSPITAL_COMMUNITY): Payer: BC Managed Care – PPO | Admitting: Specialist

## 2013-09-10 ENCOUNTER — Other Ambulatory Visit: Payer: Self-pay | Admitting: *Deleted

## 2013-09-10 ENCOUNTER — Telehealth: Payer: Self-pay | Admitting: Orthopedic Surgery

## 2013-09-10 DIAGNOSIS — IMO0001 Reserved for inherently not codable concepts without codable children: Secondary | ICD-10-CM | POA: Diagnosis not present

## 2013-09-10 MED ORDER — CYCLOBENZAPRINE HCL 5 MG PO TABS
5.0000 mg | ORAL_TABLET | Freq: Three times a day (TID) | ORAL | Status: DC | PRN
Start: 2013-09-10 — End: 2014-01-07

## 2013-09-10 NOTE — Telephone Encounter (Signed)
Routing to Dr Harrison 

## 2013-09-10 NOTE — Telephone Encounter (Signed)
MEDICATION SENT TO PHARMACY, CALLED PATIENT, NO ANSWER, LEFT VM

## 2013-09-10 NOTE — Progress Notes (Signed)
Occupational Therapy Treatment Patient Details  Name: Hannah Contreras MRN: [id number] Date of Birth: 1950/08/07  Today's Date: 09/10/2013 Time: 5621-3086 OT Time Calculation (min): 40 min Manual therapy [phone] 31' Therapeutic exercises 1008-1017 9'  Visit#: 3 of 16  Re-eval: 09/24/13     Subjective  S:  Its ok, Im still healing Pain Assessment Currently in Pain?: Yes Pain Score: 3  Pain Location: Shoulder Pain Orientation: Left Pain Type: Acute pain Effect of Pain on Daily Activities: dull aching feeling'  Precautions/Restrictions   PROM through 6 weeks (09/11/13)  Exercise/Treatments Supine Protraction: PROM;10 reps Horizontal ABduction: PROM;10 reps External Rotation: PROM;10 reps Internal Rotation: PROM;10 reps Flexion: PROM;10 reps ABduction: PROM;10 reps Seated Elevation: AROM;10 reps Extension: AROM;10 reps Row: AROM;10 reps Therapy Ball Flexion: 15 reps ABduction: 15 reps Isometric Strengthening   Flexion: Supine;5X5" Extension: Supine;5X5" External Rotation: Supine;5X5" Internal Rotation: Supine;5X5" ABduction: Supine;5X5" ADduction: Supine;5X5"    Manual Therapy Manual Therapy: Myofascial release Myofascial Release: Myofascial release to left elbow, upper arm, trapezius and scapularis region to decrease fascial restriction and increase joint mobility in a pain free zone.  Added MFR to cervical region and bilateral sternocleidomastoid regions.   Occupational Therapy Assessment and Plan OT Assessment and Plan Clinical Impression Statement: A:  Increased to 5" isometric contractions.  Added manual cervical traction and MFR to sternocleidomastoid region.  OT Plan: P:  Attempt AAROM in supine.    Goals Short Term Goals Time to Complete Short Term Goals: 4 weeks Short Term Goal 1: Patient will be educated on HEP. Short Term Goal 2: Patient will increase AROM to [facility] to increase ability to donn shirt as she did prior to fracture and with less  difficulty Short Term Goal 3: Patient will increase shoulder strength to 3+/5 to increase ability to reach into overhead cabinets with less difficulty. Short Term Goal 4: Patient will decrease fascial restrictions from mod to min amount.  Long Term Goals Time to Complete Long Term Goals: 8 weeks Long Term Goal 1: Patient will return to highest level of independence with all BADL and leisure tasks. Long Term Goal 2: Patient will increase AROM to WNL to increase ability to donn shirt as she did prior to fracture and with less difficulty. Long Term Goal 3: Patient will increase shoulder strength to 4/5 to increase ability to lift heavy items with less difficulty Long Term Goal 4: Patient will report not pain or discomfort in left shoulder while completing daily tasks.  Long Term Goal 5: Patient will decrease fascial restrictions from min to zero amount.   Problem List Patient Active Problem List   Diagnosis Date Noted  . Muscle weakness (generalized) 08/27/2013  . Decreased range of motion (ROM) of shoulder 08/27/2013  . Fracture of proximal end of left humerus 08/04/2013  . SHOULDER PAIN 02/16/2009  . IMPINGEMENT SYNDROME 02/16/2009    End of Session Activity Tolerance: Patient tolerated treatment well General Behavior During Therapy: [facility] for tasks assessed/performed  [place], OTR/L [phone]  09/10/2013, 10:22 AM

## 2013-09-10 NOTE — Telephone Encounter (Signed)
FLEXERIL 5 Q 8 # [phone]

## 2013-09-10 NOTE — Telephone Encounter (Signed)
Patient called to inquire if she can be prescribed muscle relaxer; states had started physical therapy and "is very tight" - said that therapist thought this may help if Dr Aline Brochure would consider.  Her pharmacy is [facility], [place]; allergic to codeine.  Her next scheduled appointment is 09/24/13.  Her ph# is [phone]

## 2013-09-14 ENCOUNTER — Ambulatory Visit (HOSPITAL_COMMUNITY)
Admission: RE | Admit: 2013-09-14 | Discharge: 2013-09-14 | Disposition: A | Discharge status: Home / Self Care | Payer: BC Managed Care – PPO | Source: Ambulatory Visit | Attending: Internal Medicine | Admitting: Internal Medicine

## 2013-09-14 DIAGNOSIS — IMO0001 Reserved for inherently not codable concepts without codable children: Secondary | ICD-10-CM | POA: Diagnosis not present

## 2013-09-14 NOTE — Progress Notes (Signed)
Occupational Therapy Treatment Patient Details  Name: Hannah Contreras MRN: [id number] Date of Birth: October 08, 1950  Today's Date: 09/14/2013 Time: 1520-1600 OT Time Calculation (min): 40 min Manual therapy [phone] 22' Therapeutic exercises 1542-1600 18' Visit#: 4 of 16  Re-eval: 09/24/13     Subjective Symptoms/Limitations Symptoms: S:  "Its feeling ok." Limitations: begin AAROM on 09/12/13 Pain Assessment Currently in Pain?: Yes Pain Score: 1  Pain Location: Shoulder Pain Orientation: Left Pain Type: Acute pain  Precautions/Restrictions   begin AAROM  Exercise/Treatments Supine Protraction: PROM;AAROM;10 reps Horizontal ABduction: PROM;AAROM;10 reps External Rotation: PROM;10 reps;AAROM Internal Rotation: PROM;AAROM;10 reps Flexion: PROM;AAROM;10 reps ABduction: PROM;AAROM;10 reps Seated Elevation: AROM;15 reps Extension: AROM;15 reps Row: AROM;15 reps Therapy Ball Flexion: 20 reps ABduction: 20 reps ROM / Strengthening / Isometric Strengthening Thumb Tacks: 1' Prot/Ret//Elev/Dep: 1' Flexion: Supine;5X5" Extension: Supine;5X5" External Rotation: Supine;5X5" Internal Rotation: Supine;5X5" ABduction: Supine;5X5" ADduction: Supine;5X5"    Manual Therapy Manual Therapy: Myofascial release Myofascial Release: Myofascial release to left elbow, upper arm, trapezius and scapularis region to decrease fascial restriction and increase joint mobility in a pain free zone. Added MFR to cervical region and bilateral sternocleidomastoid regions.  Occupational Therapy Assessment and Plan OT Assessment and Plan Clinical Impression Statement: A:  Added AAROM in supine this date with good form. OT Plan: P:  Issue AAROM HEP.    Goals Short Term Goals Time to Complete Short Term Goals: 4 weeks Short Term Goal 1: Patient will be educated on HEP. Short Term Goal 1 Progress: Progressing toward goal Short Term Goal 2: Patient will increase AROM to [facility] to increase ability to  donn shirt as she did prior to fracture and with less difficulty Short Term Goal 2 Progress: Progressing toward goal Short Term Goal 3: Patient will increase shoulder strength to 3+/5 to increase ability to reach into overhead cabinets with less difficulty. Short Term Goal 3 Progress: Progressing toward goal Short Term Goal 4: Patient will decrease fascial restrictions from mod to min amount.  Short Term Goal 4 Progress: Progressing toward goal Long Term Goals Time to Complete Long Term Goals: 8 weeks Long Term Goal 1: Patient will return to highest level of independence with all BADL and leisure tasks. Long Term Goal 1 Progress: Progressing toward goal Long Term Goal 2: Patient will increase AROM to WNL to increase ability to donn shirt as she did prior to fracture and with less difficulty. Long Term Goal 2 Progress: Progressing toward goal Long Term Goal 3: Patient will increase shoulder strength to 4/5 to increase ability to lift heavy items with less difficulty Long Term Goal 3 Progress: Progressing toward goal Long Term Goal 4: Patient will report not pain or discomfort in left shoulder while completing daily tasks.  Long Term Goal 4 Progress: Progressing toward goal Long Term Goal 5: Patient will decrease fascial restrictions from min to zero amount.  Long Term Goal 5 Progress: Progressing toward goal  Problem List Patient Active Problem List   Diagnosis Date Noted  . Muscle weakness (generalized) 08/27/2013  . Decreased range of motion (ROM) of shoulder 08/27/2013  . Fracture of proximal end of left humerus 08/04/2013  . SHOULDER PAIN 02/16/2009  . IMPINGEMENT SYNDROME 02/16/2009    End of Session Activity Tolerance: Patient tolerated treatment well General Behavior During Therapy: [facility] for tasks assessed/performed  [place], OTR/L [phone]  09/14/2013, 4:08 PM

## 2013-09-15 ENCOUNTER — Ambulatory Visit (HOSPITAL_COMMUNITY): Payer: BC Managed Care – PPO

## 2013-09-16 ENCOUNTER — Ambulatory Visit (HOSPITAL_COMMUNITY)
Admission: RE | Admit: 2013-09-16 | Discharge: 2013-09-16 | Disposition: A | Discharge status: Home / Self Care | Payer: BC Managed Care – PPO | Source: Ambulatory Visit | Attending: Internal Medicine | Admitting: Internal Medicine

## 2013-09-16 DIAGNOSIS — IMO0001 Reserved for inherently not codable concepts without codable children: Secondary | ICD-10-CM | POA: Diagnosis not present

## 2013-09-16 NOTE — Progress Notes (Signed)
Note reviewed by clinical instructor and accurately reflects treatment session.  Knute Mazzuca, OTR/L,CBIS   

## 2013-09-16 NOTE — Progress Notes (Signed)
Occupational Therapy Treatment Patient Details  Name: Elivia Robotham MRN: [id number] Date of Birth: 1950-04-29  Today's Date: 09/16/2013 Time: 8546-2703 OT Time Calculation (min): 38 min MFR: 5009-3818 12' Therex: 2993-7169 67'  Visit#: 5 of 16  Re-eval: 09/24/13   Subjective Symptoms/Limitations Symptoms: S: It's doing good, not getting better as fast as I would like, but it's ok.  Pain Assessment Currently in Pain?: No/denies  Precautions/Restrictions  Precautions Precautions: Shoulder  Exercise/Treatments Supine Protraction: PROM;5 reps;AAROM;10 reps Horizontal ABduction: PROM;5 reps;AAROM;10 reps External Rotation: PROM;5 reps;AAROM;10 reps Internal Rotation: PROM;5 reps;AAROM;10 reps Flexion: PROM;5 reps;AAROM;10 reps ABduction: PROM;5 reps;AAROM;10 reps   Standing Protraction: AAROM;10 reps Horizontal ABduction: AAROM;10 reps External Rotation: AAROM;10 reps Internal Rotation: AAROM;10 reps Flexion: AAROM;10 reps ABduction: AAROM;10 reps   ROM / Strengthening / Isometric Strengthening Wall Wash: 1' Thumb Tacks: 1' Prot/Ret//Elev/Dep: 1'        Manual Therapy Manual Therapy: Myofascial release Myofascial Release: Myofascial release to left elbow, upper arm, trapezius and scapularis region to decrease fascial restriction and increase joint mobility in a pain free zone  Occupational Therapy Assessment and Plan OT Assessment and Plan Clinical Impression Statement: A: Added AAROM exercise in standing, wall wash.  Patient tolerated well. Provided AAROM HEP to patient.  OT Plan: P: Increase AAROM exercises to 12 repetitions.    Goals Short Term Goals Time to Complete Short Term Goals: 4 weeks Short Term Goal 1: Patient will be educated on HEP. Short Term Goal 2: Patient will increase AROM to [facility] to increase ability to donn shirt as she did prior to fracture and with less difficulty Short Term Goal 3: Patient will increase shoulder strength to 3+/5 to  increase ability to reach into overhead cabinets with less difficulty. Short Term Goal 4: Patient will decrease fascial restrictions from mod to min amount.  Long Term Goals Time to Complete Long Term Goals: 8 weeks Long Term Goal 1: Patient will return to highest level of independence with all BADL and leisure tasks. Long Term Goal 2: Patient will increase AROM to WNL to increase ability to donn shirt as she did prior to fracture and with less difficulty. Long Term Goal 3: Patient will increase shoulder strength to 4/5 to increase ability to lift heavy items with less difficulty Long Term Goal 4: Patient will report not pain or discomfort in left shoulder while completing daily tasks.  Long Term Goal 5: Patient will decrease fascial restrictions from min to zero amount.   Problem List Patient Active Problem List   Diagnosis Date Noted  . Muscle weakness (generalized) 08/27/2013  . Decreased range of motion (ROM) of shoulder 08/27/2013  . Fracture of proximal end of left humerus 08/04/2013  . SHOULDER PAIN 02/16/2009  . IMPINGEMENT SYNDROME 02/16/2009    End of Session Activity Tolerance: Patient tolerated treatment well General Behavior During Therapy: [facility] for tasks assessed/performed OT Plan of Care OT Home Exercise Plan: AAROM exercises OT Patient Instructions: handout (scanned) Consulted and Agree with Plan of Care: Patient     Guadelupe Sabin. OT Student  09/16/2013, 5:13 PM

## 2013-09-17 ENCOUNTER — Ambulatory Visit (HOSPITAL_COMMUNITY)
Admission: RE | Admit: 2013-09-17 | Discharge: 2013-09-17 | Disposition: A | Discharge status: Home / Self Care | Payer: BC Managed Care – PPO | Source: Ambulatory Visit | Attending: Internal Medicine | Admitting: Internal Medicine

## 2013-09-17 ENCOUNTER — Ambulatory Visit (HOSPITAL_COMMUNITY): Payer: BC Managed Care – PPO

## 2013-09-17 DIAGNOSIS — IMO0001 Reserved for inherently not codable concepts without codable children: Secondary | ICD-10-CM | POA: Diagnosis not present

## 2013-09-17 NOTE — Progress Notes (Signed)
Note reviewed by clinical instructor and accurately reflects treatment session.  Barbar Brede, OTR/L,CBIS   

## 2013-09-17 NOTE — Progress Notes (Signed)
Occupational Therapy Treatment Patient Details  Name: Hannah Contreras MRN: [id number] Date of Birth: Apr 11, 1950  Today's Date: 09/17/2013 Time: 4742-5956 OT Time Calculation (min): 31 min MFR: 3875-6433 14' Therex: 2951-8841 17'  Visit#: 6 of 16  Re-eval: 09/24/13    Subjective Symptoms/Limitations Symptoms: S: I woke up at 4 am with a lot of pain in my shoulder.  I put some heat on it.  Pain Assessment Currently in Pain?: No/denies  Precautions/Restrictions  Precautions Precautions: Shoulder  Exercise/Treatments Supine Protraction: PROM;5 reps;AAROM;12 reps Horizontal ABduction: PROM;5 reps;AAROM;12 reps External Rotation: PROM;5 reps;AAROM;12 reps Internal Rotation: PROM;5 reps;AAROM;12 reps Flexion: PROM;5 reps;AAROM;12 reps ABduction: PROM;5 reps;AAROM;12 reps   Standing Protraction: AAROM;12 reps Horizontal ABduction: AAROM;12 reps External Rotation: AAROM;12 reps Internal Rotation: AAROM;12 reps Flexion: AAROM;12 reps ABduction: AAROM;12 reps   ROM / Strengthening / Isometric Strengthening Wall Wash: 1' Prot/Ret//Elev/Dep: 1'        Manual Therapy Manual Therapy: Myofascial release Myofascial Release: Myofascial release to left elbow, upper arm, trapezius and scapularis region to decrease fascial restriction and increase joint mobility in a pain free zone  Occupational Therapy Assessment and Plan OT Assessment and Plan Clinical Impression Statement: A: Increased AAROM exercises to 12 repetitions in supine and standing.  Patient tolerated well. Patient reported waking up with 7/10 pain at 4am, pt applied heat.   OT Plan: P: Reassess. Follow up on HEP.    Goals Short Term Goals Time to Complete Short Term Goals: 4 weeks Short Term Goal 1: Patient will be educated on HEP. Short Term Goal 2: Patient will increase AROM to [facility] to increase ability to donn shirt as she did prior to fracture and with less difficulty Short Term Goal 3: Patient will increase  shoulder strength to 3+/5 to increase ability to reach into overhead cabinets with less difficulty. Short Term Goal 4: Patient will decrease fascial restrictions from mod to min amount.  Long Term Goals Time to Complete Long Term Goals: 8 weeks Long Term Goal 1: Patient will return to highest level of independence with all BADL and leisure tasks. Long Term Goal 2: Patient will increase AROM to WNL to increase ability to donn shirt as she did prior to fracture and with less difficulty. Long Term Goal 3: Patient will increase shoulder strength to 4/5 to increase ability to lift heavy items with less difficulty Long Term Goal 4: Patient will report not pain or discomfort in left shoulder while completing daily tasks.  Long Term Goal 5: Patient will decrease fascial restrictions from min to zero amount.   Problem List Patient Active Problem List   Diagnosis Date Noted  . Muscle weakness (generalized) 08/27/2013  . Decreased range of motion (ROM) of shoulder 08/27/2013  . Fracture of proximal end of left humerus 08/04/2013  . SHOULDER PAIN 02/16/2009  . IMPINGEMENT SYNDROME 02/16/2009    End of Session Activity Tolerance: Patient tolerated treatment well General Behavior During Therapy: [facility] for tasks assessed/performed     Guadelupe Sabin. OT Student  09/17/2013, 10:22 AM

## 2013-09-21 ENCOUNTER — Ambulatory Visit (HOSPITAL_COMMUNITY): Payer: BC Managed Care – PPO

## 2013-09-23 ENCOUNTER — Ambulatory Visit (HOSPITAL_COMMUNITY)
Admission: RE | Admit: 2013-09-23 | Discharge: 2013-09-23 | Disposition: A | Discharge status: Home / Self Care | Payer: BC Managed Care – PPO | Source: Ambulatory Visit | Attending: Internal Medicine | Admitting: Internal Medicine

## 2013-09-23 DIAGNOSIS — IMO0001 Reserved for inherently not codable concepts without codable children: Secondary | ICD-10-CM | POA: Diagnosis not present

## 2013-09-23 NOTE — Evaluation (Signed)
Occupational Therapy Re-Evaluation  Patient Details  Name: Hannah Contreras MRN: [id number] Date of Birth: May 01, 1950  Today's Date: 09/23/2013 Time: 1110-1155 OT Time Calculation (min): 45 min Manual 1110-1125 (15') ROM Testing 1125-1135 (10') Therapeutic Exercises 1135-1145 ([place] 1134-1155 (10')  Visit#: 7 of 16  Re-eval: 10/21/13  Assessment Diagnosis: Left proximal humerus fracture Next MD Visit: 09/24/13 - Aline Brochure  Authorization:    Authorization Time Period:    Authorization Visit#:   of     Past Medical History: No past medical history on file. Past Surgical History:  Past Surgical History  Procedure Laterality Date  . Cesarean section    . Cholecystectomy    . Other surgical history      history of multiple fractures.  right hip., left arm and elbow, right arm.   . Hip surgery Right 2001    Subjective Symptoms/Limitations Symptoms: "Everything has gotten better. All i could do before was this (move my elbow).  Now I can put my arm up over my head. There's a little bit of pain invovled.  The more I work on it the better it gets." Special Tests: FOTO score: 67/100; previous 52/100 Pain Assessment Currently in Pain?: No/denies  Assessment ADL/Vision/Perception ADL ADL Comments: Pt has transitioned from usiong right arm as dominant to useing left consistently.  Pt has no difficulty with dressing.  Additional Assessments LUE AROM (degrees) LUE Overall AROM Comments: Assessed in supine (and standing) with ER/IR adducted Left Shoulder Flexion: 154 Degrees ((137)   previous supine 130) Left Shoulder ABduction: 147 Degrees ((134) previous supine 86) Left Shoulder Internal Rotation: 100 Degrees ((95)  previous supine 90) Left Shoulder External Rotation: 72 Degrees ((71) previous supine 60) Palpation Palpation: min fascial restricions in left bicep and upper trap regions    Exercise/Treatments Supine Protraction: PROM;5 reps;AAROM;15 reps Horizontal  ABduction: PROM;5 reps;AAROM;15 reps External Rotation: PROM;5 reps;AAROM;15 reps Internal Rotation: PROM;5 reps;AAROM;15 reps Flexion: PROM;5 reps;AAROM;15 reps ABduction: PROM;5 reps;AAROM;15 reps      Manual Therapy Manual Therapy: Myofascial release Myofascial Release: Myofascial release to left elbow, upper arm, trapezius and scapularis region to decrease fascial restriction and increase joint mobility in a pain free zone.    Occupational Therapy Assessment and Plan OT Assessment and Plan Clinical Impression Statement: Reassessmetn completed this session.  pt has progressed well in therapy,meeting all ofher STG and progressing towards each of her LTG.  Increased supine AAROM to 15 reps with good tolerance.  Pt tolerated well AROM measuremetns in both supine and standing.   Pt will benefit from skilled therapeutic intervention in order to improve on the following deficits: Pain;Increased fascial restricitons;Decreased strength;Increased edema;Decreased range of motion Rehab Potential: Excellent OT Frequency: Min 2X/week OT Duration: 4 weeks OT Treatment/Interventions: Self-care/ADL training;Modalities;Therapeutic exercise;Therapeutic activities;Manual therapy;Patient/family education OT Plan: Progress to AROM supine.   Goals Short Term Goals Short Term Goal 1: Patient will be educated on HEP. Short Term Goal 1 Progress: Met Short Term Goal 2: Patient will increase AROM to [facility] to increase ability to donn shirt as she did prior to fracture and with less difficulty Short Term Goal 2 Progress: Met Short Term Goal 3: Patient will increase shoulder strength to 3+/5 to increase ability to reach into overhead cabinets with less difficulty. Short Term Goal 3 Progress: Met Short Term Goal 4: Patient will decrease fascial restrictions from mod to min amount.  Short Term Goal 4 Progress: Met Long Term Goals Long Term Goal 1: Patient will return to highest level of  independence with all BADL  and leisure tasks. Long Term Goal 1 Progress: Progressing toward goal Long Term Goal 2: Patient will increase AROM to WNL to increase ability to donn shirt as she did prior to fracture and with less difficulty. Long Term Goal 2 Progress: Progressing toward goal Long Term Goal 3: Patient will increase shoulder strength to 4/5 to increase ability to lift heavy items with less difficulty Long Term Goal 3 Progress: Progressing toward goal Long Term Goal 4: Patient will report not pain or discomfort in left shoulder while completing daily tasks.  Long Term Goal 4 Progress: Progressing toward goal Long Term Goal 5: Patient will decrease fascial restrictions from min to zero amount.  Long Term Goal 5 Progress: Progressing toward goal  Problem List Patient Active Problem List   Diagnosis Date Noted  . Muscle weakness (generalized) 08/27/2013  . Decreased range of motion (ROM) of shoulder 08/27/2013  . Fracture of proximal end of left humerus 08/04/2013  . SHOULDER PAIN 02/16/2009  . IMPINGEMENT SYNDROME 02/16/2009    End of Session Activity Tolerance: Patient tolerated treatment well General Behavior During Therapy: [facility] for tasks assessed/performed  GO    Bea Graff, MS, OTR/L [phone]  09/23/2013, 12:02 PM  Physician Documentation Your signature is required to indicate approval of the treatment plan as stated above.  Please sign and either send electronically or make a copy of this report for your files and return this physician signed original.  Please mark one 1.__approve of plan  2. ___approve of plan with the following conditions.   ______________________________                                                          _____________________ Physician Signature                                                                                                             Date

## 2013-09-24 ENCOUNTER — Ambulatory Visit (INDEPENDENT_AMBULATORY_CARE_PROVIDER_SITE_OTHER): Payer: Self-pay | Admitting: Orthopedic Surgery

## 2013-09-24 ENCOUNTER — Ambulatory Visit (HOSPITAL_COMMUNITY): Payer: BC Managed Care – PPO

## 2013-09-24 VITALS — Ht 64.0 in | Wt 160.0 lb

## 2013-09-24 DIAGNOSIS — S42309D Unspecified fracture of shaft of humerus, unspecified arm, subsequent encounter for fracture with routine healing: Secondary | ICD-10-CM

## 2013-09-24 DIAGNOSIS — S4292XD Fracture of left shoulder girdle, part unspecified, subsequent encounter for fracture with routine healing: Secondary | ICD-10-CM

## 2013-09-24 NOTE — Evaluation (Signed)
SBNR

## 2013-09-24 NOTE — Progress Notes (Signed)
Fracture care followup  Chief Complaint  Patient presents with  . Follow-up    6 week recheck on left shoulder fracture, DOI 07-31-13.   Ht 5\' 4"  (1.626 m)  Wt 160 lb (72.576 kg)  BMI 27.45 kg/m2  Doing well she has regained almost full range of motion her internal rotation to level short of normal and her forward elevation is 125  Continue therapy and followup in a month

## 2013-09-25 ENCOUNTER — Ambulatory Visit (HOSPITAL_COMMUNITY)
Admission: RE | Admit: 2013-09-25 | Discharge: 2013-09-25 | Disposition: A | Discharge status: Home / Self Care | Payer: BC Managed Care – PPO | Source: Ambulatory Visit | Attending: Internal Medicine | Admitting: Internal Medicine

## 2013-09-25 DIAGNOSIS — IMO0001 Reserved for inherently not codable concepts without codable children: Secondary | ICD-10-CM | POA: Diagnosis not present

## 2013-09-25 NOTE — Progress Notes (Signed)
Occupational Therapy Treatment Patient Details  Name: Hannah Contreras MRN: [id number] Date of Birth: 1950-10-13  Today's Date: 09/25/2013 Time: 1110-1150 OT Time Calculation (min): 40 min Manual therapy [phone] 15' Therapeutic exercises 1125-1150 25'  Visit#: 8 of 16  Re-eval: 10/21/13    Subjective  S:  I went to the MD and he thinks I am doing very well. Limitations: progress as tolerated Pain Assessment Currently in Pain?: No/denies Pain Score: 0-No pain  Precautions/Restrictions     Exercise/Treatments Supine Protraction: PROM;AROM;10 reps Horizontal ABduction: PROM;AROM;10 reps External Rotation: PROM;AROM;10 reps Internal Rotation: PROM;AROM;10 reps Flexion: PROM;AROM;10 reps ABduction: PROM;AROM;10 reps Standing Protraction: AROM;10 reps Horizontal ABduction: AROM;10 reps External Rotation: AROM;10 reps Internal Rotation: AROM;10 reps Flexion: AROM;10 reps ABduction: AROM;10 reps Therapy Ball Flexion: 25 reps ABduction: 25 reps Right/Left: 5 reps ROM / Strengthening / Isometric Strengthening UBE (Upper Arm Bike): 2' forward and 2' reverse at 1.0 Wall Wash: 2 minutes  Proximal Shoulder Strengthening, Supine: 10 times each without resting       Manual Therapy Manual Therapy: Myofascial release Myofascial Release: Myofascial release to left elbow, upper arm, trapezius and scapularis region to decrease fascial restriction and increase joint mobility in a pain free zone.   Occupational Therapy Assessment and Plan OT Assessment and Plan Clinical Impression Statement: A:  Added AROM in supine and seated this date with good results and form.  Added AROM to HEP.  OT Plan: P:  Increase AROM repetitions in supine and standing   Goals Short Term Goals Short Term Goal 1: Patient will be educated on HEP. Short Term Goal 2: Patient will increase AROM to Hannah Contreras to increase ability to donn shirt as she did prior to fracture and with less difficulty Short Term Goal  3: Patient will increase shoulder strength to 3+/5 to increase ability to reach into overhead cabinets with less difficulty. Short Term Goal 4: Patient will decrease fascial restrictions from mod to min amount.  Long Term Goals Long Term Goal 1: Patient will return to highest level of independence with all BADL and leisure tasks. Long Term Goal 2: Patient will increase AROM to WNL to increase ability to donn shirt as she did prior to fracture and with less difficulty. Long Term Goal 3: Patient will increase shoulder strength to 4/5 to increase ability to lift heavy items with less difficulty Long Term Goal 4: Patient will report not pain or discomfort in left shoulder while completing daily tasks.  Long Term Goal 5: Patient will decrease fascial restrictions from min to zero amount.   Problem List Patient Active Problem List   Diagnosis Date Noted  . Muscle weakness (generalized) 08/27/2013  . Decreased range of motion (ROM) of shoulder 08/27/2013  . Fracture of proximal end of left humerus 08/04/2013  . SHOULDER PAIN 02/16/2009  . IMPINGEMENT SYNDROME 02/16/2009    End of Session Activity Tolerance: Patient tolerated treatment well General Behavior During Therapy: [facility] for tasks assessed/performed OT Plan of Care OT Home Exercise Plan: AROM in supine and seated OT Patient Instructions: hand out not scanned Consulted and Agree with Plan of Care: Patient  [place] Contreras, OTR/L [phone]  09/25/2013, 11:58 AM

## 2013-09-28 ENCOUNTER — Ambulatory Visit (HOSPITAL_COMMUNITY)
Admission: RE | Admit: 2013-09-28 | Discharge: 2013-09-28 | Disposition: A | Discharge status: Home / Self Care | Payer: BC Managed Care – PPO | Source: Ambulatory Visit | Attending: Internal Medicine | Admitting: Internal Medicine

## 2013-09-28 DIAGNOSIS — IMO0001 Reserved for inherently not codable concepts without codable children: Secondary | ICD-10-CM | POA: Diagnosis not present

## 2013-09-28 NOTE — Progress Notes (Signed)
Occupational Therapy Treatment Patient Details  Name: Bo Rogue MRN: [id number] Date of Birth: 1950-02-21  Today's Date: 09/28/2013 Time: 6761-9509 OT Time Calculation (min): 43 min Manual therapy [phone] 18' Therapeutic exercises 1000-1025 25'  Visit#: 9 of 16  Re-eval: 10/21/13     Subjective S;  I tried the exercises.  I did them 3 times yesterday and I was sore. Limitations: progress as tolerated Pain Assessment Currently in Pain?: Yes Pain Score: 2  Pain Location: Shoulder Pain Orientation: Left Pain Type: Acute pain  Precautions/Restrictions     Exercise/Treatments Supine Protraction: PROM;10 reps;AROM;12 reps Horizontal ABduction: PROM;10 reps;AROM;12 reps External Rotation: PROM;10 reps;AROM;12 reps Internal Rotation: PROM;10 reps;AROM;12 reps Flexion: PROM;10 reps;AROM;12 reps ABduction: PROM;5 reps;AROM;12 reps Standing Protraction: AROM;12 reps Horizontal ABduction: AROM;12 reps External Rotation: AROM;12 reps Internal Rotation: AROM;12 reps Flexion: AROM;12 reps ABduction: AROM;12 reps (limited at end range due to tightness/weakness) Extension: Theraband;10 reps Theraband Level (Shoulder Extension): Level 2 (Red) Row: Theraband;10 reps Theraband Level (Shoulder Row): Level 2 (Red) Retraction: Theraband;10 reps Theraband Level (Shoulder Retraction): Level 2 (Red) Therapy Ball Flexion:  (dc) ABduction: 25 reps Right/Left: 5 reps ROM / Strengthening / Isometric Strengthening UBE (Upper Arm Bike): 2' forward and 2' reverse at 1.0 Wall Wash: resume next visit  Proximal Shoulder Strengthening, Supine: 10 times each without resting Proximal Shoulder Strengthening, Seated: 10 times each  Ball on Wall: 30" flexed to 90 and 30" abducted to 90      Manual Therapy Manual Therapy: Myofascial release Myofascial Release: Myofascial release to left shoulder, elbow, upper arm, trapezius and scapularis region to decrease fascial restriction and increase  joint mobility in a pain free zone   Occupational Therapy Assessment and Plan OT Assessment and Plan Clinical Impression Statement: A:  End range active abduction is difficult for patient, added ball on the wall and theraband for scapular stability.  OT Plan: P:  Increase to 6 minutes with UBE.  Increase to full abduction in standing   Goals Short Term Goals Short Term Goal 1: Patient will be educated on HEP. Short Term Goal 2: Patient will increase AROM to [facility] to increase ability to donn shirt as she did prior to fracture and with less difficulty Short Term Goal 3: Patient will increase shoulder strength to 3+/5 to increase ability to reach into overhead cabinets with less difficulty. Short Term Goal 4: Patient will decrease fascial restrictions from mod to min amount.  Long Term Goals Long Term Goal 1: Patient will return to highest level of independence with all BADL and leisure tasks. Long Term Goal 1 Progress: Progressing toward goal Long Term Goal 2: Patient will increase AROM to WNL to increase ability to donn shirt as she did prior to fracture and with less difficulty. Long Term Goal 2 Progress: Progressing toward goal Long Term Goal 3: Patient will increase shoulder strength to 4/5 to increase ability to lift heavy items with less difficulty Long Term Goal 3 Progress: Progressing toward goal Long Term Goal 4: Patient will report not pain or discomfort in left shoulder while completing daily tasks.  Long Term Goal 4 Progress: Progressing toward goal Long Term Goal 5: Patient will decrease fascial restrictions from min to zero amount.  Long Term Goal 5 Progress: Progressing toward goal  Problem List Patient Active Problem List   Diagnosis Date Noted  . Muscle weakness (generalized) 08/27/2013  . Decreased range of motion (ROM) of shoulder 08/27/2013  . Fracture of proximal end of left humerus 08/04/2013  .  SHOULDER PAIN 02/16/2009  . IMPINGEMENT SYNDROME 02/16/2009    End of  Session Activity Tolerance: Patient tolerated treatment well General Behavior During Therapy: [facility] for tasks assessed/performed  [place], OTR/L [phone]  09/28/2013, 10:26 AM

## 2013-09-29 ENCOUNTER — Ambulatory Visit (HOSPITAL_COMMUNITY): Payer: BC Managed Care – PPO

## 2013-09-30 ENCOUNTER — Ambulatory Visit (HOSPITAL_COMMUNITY)
Admission: RE | Admit: 2013-09-30 | Discharge: 2013-09-30 | Disposition: A | Discharge status: Home / Self Care | Payer: BC Managed Care – PPO | Source: Ambulatory Visit | Attending: Internal Medicine | Admitting: Internal Medicine

## 2013-09-30 DIAGNOSIS — IMO0001 Reserved for inherently not codable concepts without codable children: Secondary | ICD-10-CM | POA: Diagnosis not present

## 2013-09-30 NOTE — Progress Notes (Signed)
Occupational Therapy Treatment Patient Details  Name: Hannah Contreras MRN: [id number] Date of Birth: 1950/07/13  Today's Date: 09/30/2013 Time: 1601-0932 OT Time Calculation (min): 40 min MFR: 3557-3220 15' Therex: 2542-7062 25'  Visit#: 10 of 16  Re-eval: 10/21/13    Subjective Symptoms/Limitations Symptoms: S: It's doing ok today, I did my exercises three times yesterday.  Pain Assessment Currently in Pain?: Yes Pain Score: 2  Pain Location: Shoulder Pain Orientation: Left Pain Type: Acute pain  Precautions/Restrictions  Precautions Precautions: Shoulder  Exercise/Treatments Supine Protraction: PROM;5 reps;AROM;12 reps Horizontal ABduction: PROM;5 reps;AROM;12 reps External Rotation: PROM;5 reps;AROM;12 reps Internal Rotation: PROM;5 reps;AROM;12 reps Flexion: PROM;5 reps;AROM;12 reps ABduction: PROM;5 reps;AROM;12 reps   Standing Protraction: AROM;12 reps Horizontal ABduction: AROM;12 reps External Rotation: AROM;12 reps Internal Rotation: AROM;12 reps Flexion: AROM;12 reps ABduction: AROM;12 reps Extension: Theraband;15 reps Theraband Level (Shoulder Extension): Level 2 (Red) Row: Theraband;15 reps Theraband Level (Shoulder Row): Level 2 (Red) Retraction: Theraband;15 reps Theraband Level (Shoulder Retraction): Level 2 (Red)   ROM / Strengthening / Isometric Strengthening UBE (Upper Arm Bike): Level 1 3' forward 3' reverse Wall Wash: 2' Proximal Shoulder Strengthening, Supine: 10 times each without resting Proximal Shoulder Strengthening, Seated: 10 times each         Manual Therapy Manual Therapy: Myofascial release Myofascial Release: Myofascial release to left shoulder, elbow, upper arm, trapezius and scapularis region to decrease fascial restriction and increase joint mobility in a pain free zone   Occupational Therapy Assessment and Plan OT Assessment and Plan Clinical Impression Statement: A: Increased UBE to 6'. Increased theraband  exercises to 15 repetitions.  Patient tolerated treatment well. Patient able to achieve end range abduction for 7 out of 12 repetitions, abduction is till difficult due to stiffness/tightness.  OT Plan: P: Resume ball on wall exercises. Increase AROM reps to 15 in supine. Continue to increase AROM to full abduction in standing.    Goals Short Term Goals Short Term Goal 1: Patient will be educated on HEP. Short Term Goal 2: Patient will increase AROM to [facility] to increase ability to donn shirt as she did prior to fracture and with less difficulty Short Term Goal 3: Patient will increase shoulder strength to 3+/5 to increase ability to reach into overhead cabinets with less difficulty. Short Term Goal 4: Patient will decrease fascial restrictions from mod to min amount.  Long Term Goals Long Term Goal 1: Patient will return to highest level of independence with all BADL and leisure tasks. Long Term Goal 2: Patient will increase AROM to WNL to increase ability to donn shirt as she did prior to fracture and with less difficulty. Long Term Goal 3: Patient will increase shoulder strength to 4/5 to increase ability to lift heavy items with less difficulty Long Term Goal 4: Patient will report not pain or discomfort in left shoulder while completing daily tasks.  Long Term Goal 5: Patient will decrease fascial restrictions from min to zero amount.   Problem List Patient Active Problem List   Diagnosis Date Noted  . Muscle weakness (generalized) 08/27/2013  . Decreased range of motion (ROM) of shoulder 08/27/2013  . Fracture of proximal end of left humerus 08/04/2013  . SHOULDER PAIN 02/16/2009  . IMPINGEMENT SYNDROME 02/16/2009    End of Session Activity Tolerance: Patient tolerated treatment well General Behavior During Therapy: [facility] for tasks assessed/performed    Guadelupe Sabin. OT Student  09/30/2013, 11:05 AM

## 2013-09-30 NOTE — Progress Notes (Signed)
Note reviewed by clinical instructor and accurately reflects treatment session.  Nicholai Willette, OTR/L,CBIS   

## 2013-10-01 ENCOUNTER — Ambulatory Visit (HOSPITAL_COMMUNITY): Payer: BC Managed Care – PPO

## 2013-10-02 ENCOUNTER — Ambulatory Visit (HOSPITAL_COMMUNITY)
Admission: RE | Admit: 2013-10-02 | Discharge: 2013-10-02 | Disposition: A | Discharge status: Home / Self Care | Payer: BC Managed Care – PPO | Source: Ambulatory Visit | Attending: Internal Medicine | Admitting: Internal Medicine

## 2013-10-02 DIAGNOSIS — M25512 Pain in left shoulder: Secondary | ICD-10-CM | POA: Insufficient documentation

## 2013-10-02 DIAGNOSIS — Z5189 Encounter for other specified aftercare: Secondary | ICD-10-CM | POA: Diagnosis present

## 2013-10-02 DIAGNOSIS — M6281 Muscle weakness (generalized): Secondary | ICD-10-CM | POA: Diagnosis not present

## 2013-10-02 NOTE — Progress Notes (Signed)
Note reviewed by clinical instructor and accurately reflects treatment session.  Ragna Kramlich, OTR/L,CBIS   

## 2013-10-02 NOTE — Progress Notes (Signed)
Occupational Therapy Treatment Patient Details  Name: Hannah Contreras MRN: [id number] Date of Birth: 11-09-1950  Today's Date: 10/02/2013 Time: 8527-7824 OT Time Calculation (min): 41 min MFR: 2353-6144 15' Therex: 3154-0086 26'  Visit#: 11 of 16  Re-eval: 10/21/13   Subjective Symptoms/Limitations Symptoms: S: It's feeling ok, it's hurting a little but I think it's the rain. I did my exercises this morning before coming.  Pain Assessment Currently in Pain?: Yes Pain Score: 1  Pain Location: Shoulder Pain Orientation: Left Pain Type: Acute pain  Precautions/Restrictions  Precautions Precautions: Shoulder  Exercise/Treatments Supine Protraction: PROM;5 reps;AROM;15 reps Horizontal ABduction: PROM;5 reps;AROM;15 reps External Rotation: PROM;5 reps;AROM;15 reps Internal Rotation: PROM;5 reps;AROM;15 reps Flexion: PROM;5 reps;AROM;15 reps ABduction: PROM;5 reps;AROM;15 reps   Standing Protraction: AROM;12 reps Horizontal ABduction: AROM;12 reps External Rotation: AROM;12 reps Internal Rotation: AROM;12 reps Flexion: AROM;12 reps ABduction: AROM;12 reps Extension: Theraband;15 reps Theraband Level (Shoulder Extension): Level 2 (Red) Row: Theraband;15 reps Theraband Level (Shoulder Row): Level 2 (Red) Retraction: Theraband;15 reps Theraband Level (Shoulder Retraction): Level 2 (Red)   ROM / Strengthening / Isometric Strengthening UBE (Upper Arm Bike): Level 1 3' forward 3' reverse Wall Wash: 2' Proximal Shoulder Strengthening, Supine: 10 times each without resting Proximal Shoulder Strengthening, Seated: 10 times each  Ball on Wall: 30" flexed to 90 and 30" abducted to 90        Manual Therapy Manual Therapy: Myofascial release Myofascial Release: Myofascial release to left shoulder, elbow, upper arm, trapezius and scapularis region to decrease fascial restriction and increase joint mobility in a pain free zone   Occupational Therapy Assessment and Plan OT  Assessment and Plan Clinical Impression Statement: A: Increased AROM to 15 reps in supine. Patient tolerated treatment well. Patient was able to achieve end range abduction for 6/12 repetitions in standing. Patient reports she is doing her exercises at home and completed them this morning prior to coming to therapy.  OT Plan: P: Add x to v arms. Increase proximal strengthening exercises to 12 repetitions in supine and standing.  Continue to increase AROM to full abduction in standing.    Goals Short Term Goals Short Term Goal 1: Patient will be educated on HEP. Short Term Goal 2: Patient will increase AROM to [facility] to increase ability to donn shirt as she did prior to fracture and with less difficulty Short Term Goal 3: Patient will increase shoulder strength to 3+/5 to increase ability to reach into overhead cabinets with less difficulty. Short Term Goal 4: Patient will decrease fascial restrictions from mod to min amount.  Long Term Goals Long Term Goal 1: Patient will return to highest level of independence with all BADL and leisure tasks. Long Term Goal 2: Patient will increase AROM to WNL to increase ability to donn shirt as she did prior to fracture and with less difficulty. Long Term Goal 3: Patient will increase shoulder strength to 4/5 to increase ability to lift heavy items with less difficulty Long Term Goal 4: Patient will report not pain or discomfort in left shoulder while completing daily tasks.  Long Term Goal 5: Patient will decrease fascial restrictions from min to zero amount.   Problem List Patient Active Problem List   Diagnosis Date Noted  . Muscle weakness (generalized) 08/27/2013  . Decreased range of motion (ROM) of shoulder 08/27/2013  . Fracture of proximal end of left humerus 08/04/2013  . SHOULDER PAIN 02/16/2009  . IMPINGEMENT SYNDROME 02/16/2009    End of Session Activity Tolerance: Patient tolerated  treatment well General Behavior During Therapy: [facility] for  tasks assessed/performed   Guadelupe Sabin. OT Student  10/02/2013, 11:55 AM

## 2013-10-05 ENCOUNTER — Ambulatory Visit (HOSPITAL_COMMUNITY)
Admission: RE | Admit: 2013-10-05 | Discharge: 2013-10-05 | Disposition: A | Discharge status: Home / Self Care | Payer: BC Managed Care – PPO | Source: Ambulatory Visit | Attending: Internal Medicine | Admitting: Internal Medicine

## 2013-10-05 DIAGNOSIS — Z5189 Encounter for other specified aftercare: Secondary | ICD-10-CM | POA: Diagnosis not present

## 2013-10-05 NOTE — Progress Notes (Signed)
Occupational Therapy Treatment Patient Details  Name: Hannah Contreras MRN: [id number] Date of Birth: [birth year]  Today's Date: 10/05/2013 Time: 0240-9735 OT Time Calculation (min): 40 min MFR: 3299-2426 13' Therex: 8341-9622 27'  Visit#: 12 of 16  Re-eval: 10/21/13    Subjective Symptoms/Limitations Symptoms: S: I had a terrible night Saturday night, I kept waking up and my arm would just be killing me.  Pain Assessment Currently in Pain?: Yes Pain Score: 1  Pain Location: Shoulder Pain Orientation: Left Pain Type: Acute pain  Precautions/Restrictions  Precautions Precautions: Shoulder  Exercise/Treatments Supine Protraction: PROM;5 reps;AROM;15 reps Horizontal ABduction: PROM;5 reps;AROM;15 reps External Rotation: PROM;5 reps;AROM;15 reps Internal Rotation: PROM;5 reps;AROM;15 reps Flexion: PROM;5 reps;AROM;15 reps ABduction: PROM;5 reps;AROM;15 reps  Standing Protraction: AROM;12 reps Horizontal ABduction: AROM;12 reps External Rotation: AROM;12 reps;Theraband;15 reps Theraband Level (Shoulder External Rotation): Level 2 (Red) Internal Rotation: AROM;12 reps;Theraband;15 reps Theraband Level (Shoulder Internal Rotation): Level 2 (Red) Flexion: AROM;12 reps ABduction: AROM;12 reps Extension: Theraband;15 reps Theraband Level (Shoulder Extension): Level 2 (Red) Row: Theraband;15 reps Theraband Level (Shoulder Row): Level 2 (Red) Retraction: Theraband;15 reps Theraband Level (Shoulder Retraction): Level 2 (Red)    ROM / Strengthening / Isometric Strengthening UBE (Upper Arm Bike): Level 1 3' forward 3' reverse X to V Arms: 10x Proximal Shoulder Strengthening, Supine: 12x each, no rest breaks Proximal Shoulder Strengthening, Seated: 12x each no rest breaks Ball on Wall: 45" flexed to 90 and 45" abducted to 90        Manual Therapy Manual Therapy: Myofascial release Myofascial Release: Myofascial release to left shoulder, elbow, upper arm, trapezius and  scapularis region to decrease fascial restriction and increase joint mobility in a pain free zone   Occupational Therapy Assessment and Plan OT Assessment and Plan Clinical Impression Statement: A: Added x to v arms, increased proximal shoulder strengthening to 12 repetitions in supine and standing. Increased ball on wall to 45" flexion and abduction. Added ER/IR theraband exercises. Patient tolerated treatment well. Patient reported increased tightness and discomfort during night last night.  OT Plan: P: Increase AROM repetitions to 15 in standing.  Add w arms.    Goals Short Term Goals Short Term Goal 1: Patient will be educated on HEP. Short Term Goal 2: Patient will increase AROM to [facility] to increase ability to donn shirt as she did prior to fracture and with less difficulty Short Term Goal 3: Patient will increase shoulder strength to 3+/5 to increase ability to reach into overhead cabinets with less difficulty. Short Term Goal 4: Patient will decrease fascial restrictions from mod to min amount.  Long Term Goals Long Term Goal 1: Patient will return to highest level of independence with all BADL and leisure tasks. Long Term Goal 1 Progress: Progressing toward goal Long Term Goal 2: Patient will increase AROM to WNL to increase ability to donn shirt as she did prior to fracture and with less difficulty. Long Term Goal 2 Progress: Progressing toward goal Long Term Goal 3: Patient will increase shoulder strength to 4/5 to increase ability to lift heavy items with less difficulty Long Term Goal 3 Progress: Progressing toward goal Long Term Goal 4: Patient will report not pain or discomfort in left shoulder while completing daily tasks.  Long Term Goal 4 Progress: Progressing toward goal Long Term Goal 5: Patient will decrease fascial restrictions from min to zero amount.  Long Term Goal 5 Progress: Progressing toward goal  Problem List Patient Active Problem List   Diagnosis Date Noted  .  Muscle weakness (generalized) 08/27/2013  . Decreased range of motion (ROM) of shoulder 08/27/2013  . Fracture of proximal end of left humerus 08/04/2013  . SHOULDER PAIN 02/16/2009  . IMPINGEMENT SYNDROME 02/16/2009    End of Session Activity Tolerance: Patient tolerated treatment well General Behavior During Therapy: [facility] for tasks assessed/performed   Guadelupe Sabin. OT Student  10/05/2013, 10:28 AM

## 2013-10-05 NOTE — Progress Notes (Signed)
Note reviewed by clinical instructor and accurately reflects treatment session.  Milia Warth, OTR/L,CBIS   

## 2013-10-07 ENCOUNTER — Ambulatory Visit (HOSPITAL_COMMUNITY)
Admission: RE | Admit: 2013-10-07 | Discharge: 2013-10-07 | Disposition: A | Discharge status: Home / Self Care | Payer: BC Managed Care – PPO | Source: Ambulatory Visit | Attending: Internal Medicine | Admitting: Internal Medicine

## 2013-10-07 DIAGNOSIS — Z5189 Encounter for other specified aftercare: Secondary | ICD-10-CM | POA: Diagnosis not present

## 2013-10-07 NOTE — Progress Notes (Signed)
Note reviewed by clinical instructor and accurately reflects treatment session.  Laura Essenmacher, OTR/L,CBIS   

## 2013-10-07 NOTE — Progress Notes (Signed)
Occupational Therapy Treatment Patient Details  Name: Hannah Contreras MRN: [id number] Date of Birth: [birth year]  Today's Date: 10/07/2013 Time: 5638-7564 OT Time Calculation (min): 41 min MFR: 3329-5188 11' Therex: 4166-0630 30'  Visit#: 13 of 16  Re-eval: 10/21/13   Subjective Symptoms/Limitations Symptoms: S: It's feeling pretty good, I did my exercises twice yesterday.  Pain Assessment Currently in Pain?: Yes Pain Score: 1  Pain Location: Shoulder Pain Orientation: Left Pain Type: Acute pain  Precautions/Restrictions  Precautions Precautions: Shoulder  Exercise/Treatments Supine Protraction: PROM;5 reps;AROM;15 reps Horizontal ABduction: PROM;5 reps;AROM;15 reps External Rotation: PROM;5 reps;AROM;15 reps Internal Rotation: PROM;5 reps;AROM;15 reps Flexion: PROM;5 reps;AROM;15 reps ABduction: PROM;5 reps;AROM;15 reps   Standing Protraction: AROM;15 reps Horizontal ABduction: AROM;15 reps External Rotation: AROM;15 reps Theraband Level (Shoulder External Rotation): Level 2 (Red) Internal Rotation: AROM;15 reps Theraband Level (Shoulder Internal Rotation): Level 2 (Red) Flexion: AROM;15 reps ABduction: AROM;15 reps Extension: Theraband;15 reps Theraband Level (Shoulder Extension): Level 2 (Red) Row: Theraband;15 reps Theraband Level (Shoulder Row): Level 2 (Red) Retraction: Theraband;15 reps Theraband Level (Shoulder Retraction): Level 2 (Red)   ROM / Strengthening / Isometric Strengthening UBE (Upper Arm Bike): Level 1 3' forward 3' reverse "W" Arms: 12x X to V Arms: 15X Proximal Shoulder Strengthening, Supine: 15x each no rest break Proximal Shoulder Strengthening, Seated: 15X each no rest breaks Ball on Wall: 45" flexed to 90 and 45" abducted to 90        Manual Therapy Manual Therapy: Myofascial release Myofascial Release: Myofascial release to left shoulder, elbow, upper arm, trapezius and scapularis region to decrease fascial restriction and  increase joint mobility in a pain free zone   Occupational Therapy Assessment and Plan OT Assessment and Plan Clinical Impression Statement: A: Increased AROM exercises in standing to 15 repetitions. Added w arms. Increased x to v arms and proximal shoulder strengthening repetitions to 15. Patient tolerated treatment well. Patient reports she is trying to use her arm more during the day.  OT Plan: P: Attempt 1# weight in supine. Increase ball on wall to 1 minute flexion and abduction.    Goals Short Term Goals Short Term Goal 1: Patient will be educated on HEP. Short Term Goal 2: Patient will increase AROM to [facility] to increase ability to donn shirt as she did prior to fracture and with less difficulty Short Term Goal 3: Patient will increase shoulder strength to 3+/5 to increase ability to reach into overhead cabinets with less difficulty. Short Term Goal 4: Patient will decrease fascial restrictions from mod to min amount.  Long Term Goals Long Term Goal 1: Patient will return to highest level of independence with all BADL and leisure tasks. Long Term Goal 2: Patient will increase AROM to WNL to increase ability to donn shirt as she did prior to fracture and with less difficulty. Long Term Goal 3: Patient will increase shoulder strength to 4/5 to increase ability to lift heavy items with less difficulty Long Term Goal 4: Patient will report not pain or discomfort in left shoulder while completing daily tasks.  Long Term Goal 5: Patient will decrease fascial restrictions from min to zero amount.   Problem List Patient Active Problem List   Diagnosis Date Noted  . Muscle weakness (generalized) 08/27/2013  . Decreased range of motion (ROM) of shoulder 08/27/2013  . Fracture of proximal end of left humerus 08/04/2013  . SHOULDER PAIN 02/16/2009  . IMPINGEMENT SYNDROME 02/16/2009    End of Session Activity Tolerance: Patient tolerated treatment well General  Behavior During Therapy: [facility] for  tasks assessed/performed  Guadelupe Sabin. OT Student  10/07/2013, 12:07 PM

## 2013-10-09 ENCOUNTER — Ambulatory Visit (HOSPITAL_COMMUNITY)
Admission: RE | Admit: 2013-10-09 | Discharge: 2013-10-09 | Disposition: A | Discharge status: Home / Self Care | Payer: BC Managed Care – PPO | Source: Ambulatory Visit | Attending: Internal Medicine | Admitting: Internal Medicine

## 2013-10-09 DIAGNOSIS — Z5189 Encounter for other specified aftercare: Secondary | ICD-10-CM | POA: Diagnosis not present

## 2013-10-09 NOTE — Progress Notes (Signed)
Occupational Therapy Treatment Patient Details  Name: Hannah Contreras MRN: [id number] Date of Birth: [birth year]  Today's Date: 10/09/2013 Time: 8099-8338 OT Time Calculation (min): 43 min Manual therapy 250-539 16' therpapeutic exercises [phone] 27'  Visit#: 14 of 16  Re-eval: 10/21/13     Subjective Symptoms/Limitations Symptoms: S:  I did alot of yard work yesterday and its a little sore.  Limitations: progress as tolerated Pain Assessment Currently in Pain?: Yes Pain Score: 3  Pain Location: Shoulder Pain Orientation: Left Pain Type: Acute pain  Precautions/Restrictions     Exercise/Treatments Supine Protraction: PROM;Strengthening;10 reps Protraction Weight (lbs): 1 Horizontal ABduction: PROM;Strengthening;10 reps Horizontal ABduction Weight (lbs): 1 External Rotation: PROM;Strengthening;10 reps (90 abd) External Rotation Weight (lbs): 1 Internal Rotation: PROM;Strengthening;10 reps (90 abd) Internal Rotation Weight (lbs): 1 Flexion: PROM;Strengthening;10 reps Shoulder Flexion Weight (lbs): 1 ABduction: PROM;Strengthening;10 reps Shoulder ABduction Weight (lbs): 1 Standing Protraction: Strengthening;10 reps Protraction Weight (lbs): 1 Horizontal ABduction: Strengthening;10 reps Horizontal ABduction Weight (lbs): 1 External Rotation: Strengthening;10 reps External Rotation Weight (lbs): 1 Internal Rotation: Strengthening;10 reps Internal Rotation Weight (lbs): 1 Flexion: Strengthening;10 reps Shoulder Flexion Weight (lbs): 1 ABduction: Strengthening;10 reps Shoulder ABduction Weight (lbs): 1 ROM / Strengthening / Isometric Strengthening UBE (Upper Arm Bike): level 2 3' forward and 3' reverse Over Head Lace: 1' with 1# "W" Arms: 10X 1# X to V Arms: 10X 1# Proximal Shoulder Strengthening, Supine: 10 times each with 1# no rest Proximal Shoulder Strengthening, Seated: 10 times each with 1# no rest Ball on Wall: 1' flexed and 1' abducted with arm at 90  degrees and 1#       Manual Therapy Manual Therapy: Myofascial release Myofascial Release: Myofascial release to left shoulder, elbow, upper arm, trapezius and scapularis region to decrease fascial restriction and increase joint mobility in a pain free zone   Occupational Therapy Assessment and Plan OT Assessment and Plan Clinical Impression Statement: A:  Added strengthning in supine and seated with good form.  Added weight to overhead lace and w arms and x to v arms OT Plan: P:  Increase repetitions with strengthening.  Add strengthening to HEP.    Goals Short Term Goals Short Term Goal 1: Patient will be educated on HEP. Short Term Goal 2: Patient will increase AROM to [facility] to increase ability to donn shirt as she did prior to fracture and with less difficulty Short Term Goal 3: Patient will increase shoulder strength to 3+/5 to increase ability to reach into overhead cabinets with less difficulty. Short Term Goal 4: Patient will decrease fascial restrictions from mod to min amount.  Long Term Goals Long Term Goal 1: Patient will return to highest level of independence with all BADL and leisure tasks. Long Term Goal 2: Patient will increase AROM to WNL to increase ability to donn shirt as she did prior to fracture and with less difficulty. Long Term Goal 3: Patient will increase shoulder strength to 4/5 to increase ability to lift heavy items with less difficulty Long Term Goal 4: Patient will report not pain or discomfort in left shoulder while completing daily tasks.  Long Term Goal 5: Patient will decrease fascial restrictions from min to zero amount.   Problem List Patient Active Problem List   Diagnosis Date Noted  . Muscle weakness (generalized) 08/27/2013  . Decreased range of motion (ROM) of shoulder 08/27/2013  . Fracture of proximal end of left humerus 08/04/2013  . SHOULDER PAIN 02/16/2009  . IMPINGEMENT SYNDROME 02/16/2009    End  of Session Activity Tolerance:  Patient tolerated treatment well General Behavior During Therapy: [facility] for tasks assessed/performed  [place], OTR/L [phone]  10/09/2013, 10:18 AM

## 2013-10-12 ENCOUNTER — Ambulatory Visit (HOSPITAL_COMMUNITY)
Admission: RE | Admit: 2013-10-12 | Discharge: 2013-10-12 | Disposition: A | Discharge status: Home / Self Care | Payer: BC Managed Care – PPO | Source: Ambulatory Visit | Attending: Internal Medicine | Admitting: Internal Medicine

## 2013-10-12 DIAGNOSIS — Z5189 Encounter for other specified aftercare: Secondary | ICD-10-CM | POA: Diagnosis not present

## 2013-10-12 NOTE — Progress Notes (Signed)
Note reviewed by clinical instructor and accurately reflects treatment session.  Babak Lucus, OTR/L,CBIS   

## 2013-10-12 NOTE — Progress Notes (Signed)
Occupational Therapy Treatment Patient Details  Name: Deniyah Dillavou MRN: [id number] Date of Birth: [birth year]  Today's Date: 10/12/2013 Time: 5732-2025 OT Time Calculation (min): 40 min MFR: 4270-6237 12' Therex: 6283-1517 28'  Visit#: 15 of 16  Re-eval: 10/21/13   Subjective Symptoms/Limitations Symptoms: S: I detailed my car yesterday and raked leaves and it's actually feeling ok. I put heat on it this morning.  Pain Assessment Currently in Pain?: No/denies  Precautions/Restrictions  Precautions Precautions: Shoulder  Exercise/Treatments Supine Protraction: PROM;Strengthening;12 reps Protraction Weight (lbs): 1 Horizontal ABduction: PROM;Strengthening;12 reps Horizontal ABduction Weight (lbs): 1 External Rotation: PROM;Strengthening;12 reps External Rotation Weight (lbs): 1 Internal Rotation: PROM;Strengthening;12 reps Internal Rotation Weight (lbs): 1 Flexion: PROM;Strengthening;12 reps Shoulder Flexion Weight (lbs): 1 ABduction: PROM;Strengthening;12 reps Shoulder ABduction Weight (lbs): 1   Standing Protraction: Strengthening;12 reps Protraction Weight (lbs): 1 Horizontal ABduction: Strengthening;12 reps Horizontal ABduction Weight (lbs): 1 External Rotation: Strengthening;12 reps External Rotation Weight (lbs): 1 Internal Rotation: Strengthening;12 reps Internal Rotation Weight (lbs): 1 Flexion: Strengthening;12 reps Shoulder Flexion Weight (lbs): 1 ABduction: Strengthening;12 reps Shoulder ABduction Weight (lbs): 1   ROM / Strengthening / Isometric Strengthening UBE (Upper Arm Bike): level 2 3' forward and 3' reverse Over Head Lace: 2' with 1# "W" Arms: 10X 1# X to V Arms: 10X 1# Proximal Shoulder Strengthening, Supine: 10 times each with 1# no rest Proximal Shoulder Strengthening, Seated: 10 times each with 1# no rest Ball on Wall: 1' flexed and 1' abducted with arm at 90 degrees and 1#        Manual Therapy Manual Therapy: Myofascial  release Myofascial Release: Myofascial release to left shoulder, elbow, upper arm, trapezius and scapularis region to decrease fascial restriction and increase joint mobility in a pain free zone   Occupational Therapy Assessment and Plan OT Assessment and Plan Clinical Impression Statement: A: Increased strengthening repetitions to 12 in supine and standing. Increased overhead lace to 2'. Patient tolerated treatment well. Updated AROM exercises to include weights/strengthening. Patient reported doing a lot of yard work yesterday and her arm feels great.  OT Plan: P: Follow up on strengthening HEP.  Attempt Cybex row/press.    Goals Short Term Goals Short Term Goal 1: Patient will be educated on HEP. Short Term Goal 2: Patient will increase AROM to [facility] to increase ability to donn shirt as she did prior to fracture and with less difficulty Short Term Goal 3: Patient will increase shoulder strength to 3+/5 to increase ability to reach into overhead cabinets with less difficulty. Short Term Goal 4: Patient will decrease fascial restrictions from mod to min amount.  Long Term Goals Long Term Goal 1: Patient will return to highest level of independence with all BADL and leisure tasks. Long Term Goal 1 Progress: Progressing toward goal Long Term Goal 2: Patient will increase AROM to WNL to increase ability to donn shirt as she did prior to fracture and with less difficulty. Long Term Goal 2 Progress: Progressing toward goal Long Term Goal 3: Patient will increase shoulder strength to 4/5 to increase ability to lift heavy items with less difficulty Long Term Goal 3 Progress: Progressing toward goal Long Term Goal 4: Patient will report not pain or discomfort in left shoulder while completing daily tasks.  Long Term Goal 4 Progress: Progressing toward goal Long Term Goal 5: Patient will decrease fascial restrictions from min to zero amount.  Long Term Goal 5 Progress: Progressing toward  goal  Problem List Patient Active Problem List   Diagnosis  Date Noted  . Muscle weakness (generalized) 08/27/2013  . Decreased range of motion (ROM) of shoulder 08/27/2013  . Fracture of proximal end of left humerus 08/04/2013  . SHOULDER PAIN 02/16/2009  . IMPINGEMENT SYNDROME 02/16/2009    End of Session Activity Tolerance: Patient tolerated treatment well General Behavior During Therapy: [facility] for tasks assessed/performed OT Plan of Care OT Home Exercise Plan: strengthening  OT Patient Instructions: verbal instructions to add weights to current AROM exercises Consulted and Agree with Plan of Care: Patient   Guadelupe Sabin. OT Student  10/12/2013, 4:42 PM

## 2013-10-14 ENCOUNTER — Ambulatory Visit (HOSPITAL_COMMUNITY)
Admission: RE | Admit: 2013-10-14 | Discharge: 2013-10-14 | Disposition: A | Discharge status: Home / Self Care | Payer: BC Managed Care – PPO | Source: Ambulatory Visit | Attending: Internal Medicine | Admitting: Internal Medicine

## 2013-10-14 DIAGNOSIS — Z5189 Encounter for other specified aftercare: Secondary | ICD-10-CM | POA: Diagnosis not present

## 2013-10-14 NOTE — Progress Notes (Signed)
Occupational Therapy Treatment Patient Details  Name: Hannah Contreras MRN: [id number] Date of Birth: 07-30-1950  Today's Date: 10/14/2013 Time: 8127-5170 OT Time Calculation (min): 44 min MFR: 0174-9449 12' Therex: 6759-1638 35'  Visit#: 16 of 16  Re-eval: 10/21/13   Subjective Symptoms/Limitations Symptoms: S: It feels good today, it was hurting yesterday from the work I did Sunday.  Pain Assessment Currently in Pain?: Yes Pain Score: 1  Pain Location: Shoulder Pain Orientation: Left Pain Type: Acute pain  Precautions/Restrictions  Precautions Precautions: Shoulder  Exercise/Treatments Supine Protraction: PROM;5 reps;Strengthening;12 reps Protraction Weight (lbs): 1 Horizontal ABduction: PROM;5 reps;Strengthening;12 reps Horizontal ABduction Weight (lbs): 1 External Rotation: PROM;5 reps;Strengthening;12 reps External Rotation Weight (lbs): 1 Internal Rotation: PROM;5 reps;Strengthening;12 reps Internal Rotation Weight (lbs): 1 Flexion: PROM;5 reps;Strengthening;12 reps Shoulder Flexion Weight (lbs): 1 ABduction: PROM;5 reps;Strengthening;12 reps   Standing Protraction: Strengthening;12 reps Protraction Weight (lbs): 1 Horizontal ABduction: Strengthening;12 reps Horizontal ABduction Weight (lbs): 1 External Rotation: Strengthening;12 reps External Rotation Weight (lbs): 1 Internal Rotation: Strengthening;12 reps Internal Rotation Weight (lbs): 1 Flexion: Strengthening;12 reps Shoulder Flexion Weight (lbs): 1 ABduction: Strengthening;12 reps Shoulder ABduction Weight (lbs): 1   ROM / Strengthening / Isometric Strengthening UBE (Upper Arm Bike): level 2 3' forward and 3' reverse Cybex Press: 1 plate;10 reps Cybex Row: 1 plate;10 reps Over Head Lace: 2' with 1# "W" Arms: 12x 1#  X to V Arms: 12x 1# Proximal Shoulder Strengthening, Supine: 12x each, 1# weight, no rest break Proximal Shoulder Strengthening, Seated: 12x each, 1# weight, no rest break Ball  on Wall: 1\' 10"  flexed and 1\' 10"  abducted with arm at 90 degrees        Manual Therapy Manual Therapy: Myofascial release Myofascial Release: Myofascial release to left shoulder, elbow, upper arm, trapezius and scapularis region to decrease fascial restriction and increase joint mobility in a pain free zone  Occupational Therapy Assessment and Plan OT Assessment and Plan Clinical Impression Statement: A: Added Cybex row/press at 1#. Increased ball on wall to 1\' 10" .  Increased x to v and w arm repetitions to 12. Patient tolerated treatment well. Patient reported completing strengthening exercises at home.  OT Plan: P: Attempt prone hughston exercises.    Goals Short Term Goals Short Term Goal 1: Patient will be educated on HEP. Short Term Goal 2: Patient will increase AROM to [facility] to increase ability to donn shirt as she did prior to fracture and with less difficulty Short Term Goal 3: Patient will increase shoulder strength to 3+/5 to increase ability to reach into overhead cabinets with less difficulty. Short Term Goal 4: Patient will decrease fascial restrictions from mod to min amount.  Long Term Goals Long Term Goal 1: Patient will return to highest level of independence with all BADL and leisure tasks. Long Term Goal 2: Patient will increase AROM to WNL to increase ability to donn shirt as she did prior to fracture and with less difficulty. Long Term Goal 3: Patient will increase shoulder strength to 4/5 to increase ability to lift heavy items with less difficulty Long Term Goal 4: Patient will report not pain or discomfort in left shoulder while completing daily tasks.  Long Term Goal 5: Patient will decrease fascial restrictions from min to zero amount.   Problem List Patient Active Problem List   Diagnosis Date Noted  . Muscle weakness (generalized) 08/27/2013  . Decreased range of motion (ROM) of shoulder 08/27/2013  . Fracture of proximal end of left humerus 08/04/2013  .  SHOULDER PAIN 02/16/2009  . IMPINGEMENT SYNDROME 02/16/2009    End of Session Activity Tolerance: Patient tolerated treatment well General Behavior During Therapy: [facility] for tasks assessed/performed   Guadelupe Sabin. OT Student 10/14/2013, 10:17 AM

## 2013-10-14 NOTE — Progress Notes (Signed)
Note reviewed by clinical instructor and accurately reflects treatment session.  Ellianne Gowen, OTR/L,CBIS   

## 2013-10-16 ENCOUNTER — Ambulatory Visit (HOSPITAL_COMMUNITY): Payer: BC Managed Care – PPO

## 2013-10-19 ENCOUNTER — Ambulatory Visit (HOSPITAL_COMMUNITY)
Admission: RE | Admit: 2013-10-19 | Discharge: 2013-10-19 | Disposition: A | Discharge status: Home / Self Care | Payer: BC Managed Care – PPO | Source: Ambulatory Visit | Attending: Specialist | Admitting: Specialist

## 2013-10-19 DIAGNOSIS — Z5189 Encounter for other specified aftercare: Secondary | ICD-10-CM | POA: Diagnosis not present

## 2013-10-19 NOTE — Progress Notes (Signed)
Occupational Therapy Treatment Patient Details  Name: Hannah Contreras MRN: [id number] Date of Birth: 1950/02/09  Today's Date: 10/19/2013 Time: 6073-7106 OT Time Calculation (min): 41 min MFR: 2694-8546 13' Therex: 2703-5009 28'  Visit#: 65 of 16  Re-eval: 10/21/13    Subjective Symptoms/Limitations Symptoms: S: It's hurting today, I stood under the hot water for a long time this morning and took a muscle relaxer.  Pain Assessment Currently in Pain?: Yes Pain Score: 3  Pain Location: Shoulder Pain Orientation: Left Pain Type: Acute pain  Precautions/Restrictions  Precautions Precautions: Shoulder  Exercise/Treatments Supine Protraction: PROM;5 reps;Strengthening;12 reps Protraction Weight (lbs): 1 Horizontal ABduction: PROM;5 reps;Strengthening;12 reps Horizontal ABduction Weight (lbs): 1 External Rotation: PROM;5 reps;Strengthening;12 reps External Rotation Weight (lbs): 1 Internal Rotation: PROM;5 reps;Strengthening;12 reps Internal Rotation Weight (lbs): 1 Flexion: PROM;5 reps;Strengthening;12 reps Shoulder Flexion Weight (lbs): 1 ABduction: PROM;5 reps;Strengthening;12 reps Shoulder ABduction Weight (lbs): 1   Prone  Other Prone Exercises: Prone Hughston exercises, 5 positions, 10x   Standing Protraction: Strengthening;12 reps Protraction Weight (lbs): 1 Horizontal ABduction: Strengthening;12 reps Horizontal ABduction Weight (lbs): 1 External Rotation: Strengthening;12 reps External Rotation Weight (lbs): 1 Internal Rotation: Strengthening;12 reps Internal Rotation Weight (lbs): 1 Flexion: Strengthening;12 reps Shoulder Flexion Weight (lbs): 1 ABduction: Strengthening;12 reps Shoulder ABduction Weight (lbs): 1   ROM / Strengthening / Isometric Strengthening UBE (Upper Arm Bike): level 2 3' forward and 3' reverse Cybex Press: 1.5 plate;10 reps Cybex Row: 1.5 plate;10 reps "W" Arms: 12x 1#  X to V Arms: 12x 1# Proximal Shoulder Strengthening,  Supine: 12x each, 1# weight, no rest break Proximal Shoulder Strengthening, Seated: 12x each, 1# weight, no rest break      Manual Therapy Manual Therapy: Myofascial release Myofascial Release: Myofascial release to left shoulder, elbow, upper arm, trapezius and scapularis region to decrease fascial restriction and increase joint mobility in a pain free zone  Occupational Therapy Assessment and Plan OT Assessment and Plan Clinical Impression Statement: A: Added prone hughston exercises,  increased Cybex row/press to 1.5#.  Patient tolerated treatment well. Patient reported increased pain/soreness today in upper trapezius region. Patient reports she is completing her strengthening exercises at home.  OT Plan: P: Reassess. Increase strengthening to 15 repetitions in supine and standing.    Goals Short Term Goals Short Term Goal 1: Patient will be educated on HEP. Short Term Goal 2: Patient will increase AROM to [facility] to increase ability to donn shirt as she did prior to fracture and with less difficulty Short Term Goal 3: Patient will increase shoulder strength to 3+/5 to increase ability to reach into overhead cabinets with less difficulty. Short Term Goal 4: Patient will decrease fascial restrictions from mod to min amount.  Long Term Goals Long Term Goal 1: Patient will return to highest level of independence with all BADL and leisure tasks. Long Term Goal 1 Progress: Progressing toward goal Long Term Goal 2: Patient will increase AROM to WNL to increase ability to donn shirt as she did prior to fracture and with less difficulty. Long Term Goal 2 Progress: Progressing toward goal Long Term Goal 3: Patient will increase shoulder strength to 4/5 to increase ability to lift heavy items with less difficulty Long Term Goal 3 Progress: Progressing toward goal Long Term Goal 4: Patient will report not pain or discomfort in left shoulder while completing daily tasks.  Long Term Goal 4 Progress:  Progressing toward goal Long Term Goal 5: Patient will decrease fascial restrictions from min to zero amount.  Long Term Goal 5 Progress: Progressing toward goal  Problem List Patient Active Problem List   Diagnosis Date Noted  . Muscle weakness (generalized) 08/27/2013  . Decreased range of motion (ROM) of shoulder 08/27/2013  . Fracture of proximal end of left humerus 08/04/2013  . SHOULDER PAIN 02/16/2009  . IMPINGEMENT SYNDROME 02/16/2009    End of Session Activity Tolerance: Patient tolerated treatment well General Behavior During Therapy: [facility] for tasks assessed/performed   Guadelupe Sabin. OT Student  10/19/2013, 10:58 AM

## 2013-10-19 NOTE — Progress Notes (Signed)
Note reviewed by clinical instructor and accurately reflects treatment session.  Nyelle Wolfson, OTR/L,CBIS   

## 2013-10-21 ENCOUNTER — Ambulatory Visit (HOSPITAL_COMMUNITY)
Admission: RE | Admit: 2013-10-21 | Discharge: 2013-10-21 | Disposition: A | Discharge status: Home / Self Care | Payer: BC Managed Care – PPO | Source: Ambulatory Visit | Attending: Internal Medicine | Admitting: Internal Medicine

## 2013-10-21 DIAGNOSIS — Z5189 Encounter for other specified aftercare: Secondary | ICD-10-CM | POA: Diagnosis not present

## 2013-10-21 NOTE — Evaluation (Addendum)
Occupational Therapy Evaluation  Patient Details  Name: Hannah Contreras MRN: [id number] Date of Birth: 1950/12/08  Today's Date: 10/21/2013 Time: 7371-0626 OT Time Calculation (min): 30 min MFR: 9485-4627 8' Reassess: 0350-0938 22'  Visit#: 18 of 16  Re-eval:    Assessment Diagnosis: Left proximal humerus fracture   Past Medical History: No past medical history on file. Past Surgical History:  Past Surgical History  Procedure Laterality Date  . Cesarean section    . Cholecystectomy    . Other surgical history      history of multiple fractures.  right hip., left arm and elbow, right arm.   . Hip surgery Right 2001    Subjective Symptoms/Limitations Symptoms: S: It's sore today, I did a lot yesterday. I changed out my clothes and was carrying boxes up and down the stairs a lot.  Special Tests: FOTO score: 93/100; previous 67/100 Pain Assessment Currently in Pain?: Yes Pain Score: 2  Pain Location: Shoulder Pain Orientation: Left Pain Type: Acute pain  Precautions/Restrictions  Precautions Precautions: Shoulder    Assessment ADL/Vision/Perception ADL ADL Comments: Pt is able to complete all daily activities, including dressing, bathing, housekeeping, and yardwork; as well as reaching, lifting, carrying objects.     Additional Assessments LUE AROM (degrees) LUE Overall AROM Comments: Assessed in standing, ER/IR adducted Left Shoulder Flexion: 155 Degrees (Previous: 154) Left Shoulder ABduction: 156 Degrees (Previous: 147) Left Shoulder Internal Rotation: 90 Degrees Left Shoulder External Rotation: 80 Degrees (Previous: 72) LUE Strength LUE Overall Strength Comments: Assessed seated, IR/ER adducted Left Shoulder Flexion: 5/5 Left Shoulder ABduction: 5/5 Left Shoulder Internal Rotation: 4/5 Left Shoulder External Rotation: 4/5 Palpation Palpation: min fascial restricions in left bicep and upper trap regions       Manual Therapy Manual Therapy:  Myofascial release Myofascial Release: Myofascial release to left shoulder, elbow, upper arm, trapezius and scapularis region to decrease fascial restriction and increase joint mobility in a pain free zone  Occupational Therapy Assessment and Plan OT Assessment and Plan Clinical Impression Statement: A: Reassessment and discharge completed this date. Patient has met all short-term and 3/5 long-term goals. Patient was given a theraband HEP, demonstrated understanding of strengthening exercises. Patient was educated on use of a tennis ball for myofascial release technique at home, patient demonstrated understanding.  Patient is agreeable with discharge. OT Plan: P: Discharge from therapy.    Goals Short Term Goals Short Term Goal 1: Patient will be educated on HEP. Short Term Goal 2: Patient will increase AROM to [facility] to increase ability to donn shirt as she did prior to fracture and with less difficulty Short Term Goal 3: Patient will increase shoulder strength to 3+/5 to increase ability to reach into overhead cabinets with less difficulty. Short Term Goal 4: Patient will decrease fascial restrictions from mod to min amount.  Long Term Goals Long Term Goal 1: Patient will return to highest level of independence with all BADL and leisure tasks. Long Term Goal 1 Progress: Met Long Term Goal 2: Patient will increase AROM to WNL to increase ability to donn shirt as she did prior to fracture and with less difficulty. Long Term Goal 2 Progress: Not met Long Term Goal 3: Patient will increase shoulder strength to 4/5 to increase ability to lift heavy items with less difficulty Long Term Goal 3 Progress: Met Long Term Goal 4: Patient will report not pain or discomfort in left shoulder while completing daily tasks.  Long Term Goal 4 Progress: Met Long Term Goal  5: Patient will decrease fascial restrictions from min to zero amount.  Long Term Goal 5 Progress: Not met  Problem List Patient Active  Problem List   Diagnosis Date Noted  . Muscle weakness (generalized) 08/27/2013  . Decreased range of motion (ROM) of shoulder 08/27/2013  . Fracture of proximal end of left humerus 08/04/2013  . SHOULDER PAIN 02/16/2009  . IMPINGEMENT SYNDROME 02/16/2009    End of Session Activity Tolerance: Patient tolerated treatment well General Behavior During Therapy: [facility] for tasks assessed/performed OT Plan of Care OT Home Exercise Plan: green theraband HEP  OT Patient Instructions: handout (scanned) Consulted and Agree with Plan of Care: Patient   Guadelupe Sabin. OT Student  10/21/2013, 12:04 PM  Physician Documentation Your signature is required to indicate approval of the treatment plan as stated above.  Please sign and either send electronically or make a copy of this report for your files and return this physician signed original.  Please mark one 1.__approve of plan  2. ___approve of plan with the following conditions.   ______________________________                                                          _____________________ Physician Signature                                                                                                             Date

## 2013-10-21 NOTE — Evaluation (Signed)
Note reviewed by clinical instructor and accurately reflects treatment session.  Laura Essenmacher, OTR/L,CBIS   

## 2013-10-23 ENCOUNTER — Ambulatory Visit (HOSPITAL_COMMUNITY): Payer: BC Managed Care – PPO

## 2013-10-27 ENCOUNTER — Ambulatory Visit (HOSPITAL_COMMUNITY): Payer: BC Managed Care – PPO

## 2013-10-29 ENCOUNTER — Encounter: Payer: Self-pay | Admitting: Orthopedic Surgery

## 2013-10-29 ENCOUNTER — Ambulatory Visit (INDEPENDENT_AMBULATORY_CARE_PROVIDER_SITE_OTHER): Payer: Self-pay | Admitting: Orthopedic Surgery

## 2013-10-29 VITALS — BP 111/64 | Ht 64.0 in | Wt 160.0 lb

## 2013-10-29 DIAGNOSIS — S4292XD Fracture of left shoulder girdle, part unspecified, subsequent encounter for fracture with routine healing: Secondary | ICD-10-CM

## 2013-10-29 NOTE — Progress Notes (Signed)
Patient ID: Hannah Contreras, female   DOB: [birth year], 63 y.o.   MRN: [id number] Chief Complaint  Patient presents with  . Follow-up    5 week recheck Left shoulder fx, DOI 07/31/13    The patient is now approximately 12 weeks status post fracture to the left proximal humerus did well with close treatment and physical therapy. She comes in for a final checkup. She has excellent range of motion overall with a mild deficit in flexion she has excellent rotator cuff strength she's not having any pain she is released to full activity

## 2013-10-29 NOTE — Patient Instructions (Signed)
Activities as tolerated. 

## 2013-12-10 ENCOUNTER — Encounter: Payer: Self-pay | Admitting: Orthopedic Surgery

## 2013-12-10 ENCOUNTER — Ambulatory Visit (INDEPENDENT_AMBULATORY_CARE_PROVIDER_SITE_OTHER): Payer: BC Managed Care – PPO | Admitting: Orthopedic Surgery

## 2013-12-10 ENCOUNTER — Telehealth: Payer: Self-pay | Admitting: Orthopedic Surgery

## 2013-12-10 VITALS — BP 120/78 | Ht 64.0 in | Wt 150.0 lb

## 2013-12-10 DIAGNOSIS — S92302A Fracture of unspecified metatarsal bone(s), left foot, initial encounter for closed fracture: Secondary | ICD-10-CM

## 2013-12-10 MED ORDER — HYDROCODONE-ACETAMINOPHEN 5-325 MG PO TABS: 1.0000 | ORAL_TABLET | Freq: Four times a day (QID) | ORAL | Status: DC | PRN

## 2013-12-10 NOTE — Telephone Encounter (Signed)
Routing to Dr Harrison 

## 2013-12-10 NOTE — Telephone Encounter (Signed)
[facility]/Dr Jomarie Longs has just called; relays patient was seen there, has been Hannah Contreras, has metarsal fracture left foot, and requests to refer patient -- she has therefore been added to schedule for appointment today.

## 2013-12-10 NOTE — Telephone Encounter (Signed)
Patient called regarding new problem - left foot, due to fall at home last night, 12/10/13; requesting immediate appointment. I relayed that slots are blocked today due to off-site staff meeting and offered first available, as well as Emergency room/Urgent care.  Patient states that in regard to her other recent injury, shoulder fracture, for which she has been treated and released as of 10/29/13 per chart notes, that she had a large bill for Emergency Room visit,>750.00 for that injury.  Please advise if Xray can be ordered, and if next clinic day, Monday, 12/14/13, okay for appointment thereafter.  Patient's ph# is [phone].

## 2013-12-10 NOTE — Progress Notes (Signed)
Patient ID: Hannah Contreras, female   DOB: 06/13/1950, 63 y.o.   MRN: [id number]  Chief Complaint  Patient presents with  . Foot Pain    Left foot pain, DOI 12/10/13. Referred from Dr. Jomarie Longs.    HPI  Date of injury was December 10. The patient fell. She complains of sharp dull pain 6-7 out of 10 over the outer aspect of the left foot near the proximal Y aspect of the fifth metatarsal. She has difficulty walking and pain is relieved with nonweightbearing Hannah Contreras is a 63 y.o. female.  Review of Systems Review of Systems 1. Numbness and tingling there is none 2. 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  Past Surgical History  Procedure Laterality Date  . Cesarean section    . Cholecystectomy    . Other surgical history      history of multiple fractures.  right hip., left arm and elbow, right arm.   . Hip surgery Right 2001    Social History History  Substance Use Topics  . Smoking status: Never Smoker   . Smokeless tobacco: Not on file  . Alcohol Use: No    Allergies  Allergen Reactions  . Codeine Nausea Only  . Penicillins Other (See Comments)    Childhood allergy.    Current Outpatient Prescriptions  Medication Sig Dispense Refill  . cyclobenzaprine (FLEXERIL) 5 MG tablet Take 1 tablet (5 mg total) by mouth every 8 (eight) hours as needed for muscle spasms. 60 tablet 0  . diclofenac (VOLTAREN) 75 MG EC tablet Take 1 tablet (75 mg total) by mouth 2 (two) times daily. 14 tablet 0  . Dietary Management Product (TOZAL PO) Take 3 tablets by mouth daily.    Marland Kitchen HYDROcodone-acetaminophen (NORCO) 5-325 MG per tablet Take 1 tablet by mouth every 6 (six) hours as needed for moderate pain. 40 tablet 0  . oxyCODONE-acetaminophen (PERCOCET/ROXICET) 5-325 MG per tablet 1 or 2 po q6h prn pain 24 tablet 0   No current facility-administered medications for this visit.      Physical Exam Physical Exam Blood  pressure 120/78, height 5\' 4"  (1.626 m), weight 150 lb (68.04 kg).  Gen. appearance meticulously groomed well-nourished well-developed The patient is alert and oriented person place and time Mood is normal affect is normal Ambulatory status crutch ambulation with limping noted favoring the injured limb  Exam of the left foot  On inspection we see swelling and there is tenderness at the fracture site. She continues to have normal range of motion of the ankle and the ankle is stable. Muscle tone is normal without atrophy. The skin is ecchymotic but intact good distal pulses are noted in she has normal sensation.  Data Reviewed Imaging from Dr. Shona Needles office shows that she has a proximal fifth metatarsal fracture  Assessment    Encounter Diagnosis  Name Primary?  . Metatarsal fracture, left, closed, initial encounter Yes        Plan    Cam Walker for 4 weeks and then x-ray  Meds ordered this encounter  Medications  . HYDROcodone-acetaminophen (NORCO) 5-325 MG per tablet    Sig: Take 1 tablet by mouth every 6 (six) hours as needed for moderate pain.    Dispense:  40 tablet    Refill:  0          Arther Abbott 12/10/2013, 3:26 PM

## 2014-01-07 ENCOUNTER — Ambulatory Visit (INDEPENDENT_AMBULATORY_CARE_PROVIDER_SITE_OTHER): Payer: BLUE CROSS/BLUE SHIELD

## 2014-01-07 ENCOUNTER — Ambulatory Visit (INDEPENDENT_AMBULATORY_CARE_PROVIDER_SITE_OTHER): Payer: Self-pay | Admitting: Orthopedic Surgery

## 2014-01-07 VITALS — BP 141/76 | Ht 64.0 in | Wt 150.0 lb

## 2014-01-07 DIAGNOSIS — S92902D Unspecified fracture of left foot, subsequent encounter for fracture with routine healing: Secondary | ICD-10-CM

## 2014-01-07 NOTE — Patient Instructions (Signed)
Activity as tolerated

## 2014-01-07 NOTE — Progress Notes (Signed)
Patient ID: Hannah Contreras, female   DOB: August 29, 1950, 64 y.o.   MRN: [id number] Chief Complaint  Patient presents with  . Follow-up    4 week recheck + xray Left foot fx, DOI 12/09/13    Encounter Diagnosis  Name Primary?  Marland Kitchen Foot fracture, left, with routine healing, subsequent encounter Yes    The patient had a left foot proximal fifth metatarsal fracture. X-ray show fracture is healing. Clinical exam shows she has no tenderness she walked in a regular shoe without pain  Follow-up as needed

## 2015-06-14 ENCOUNTER — Other Ambulatory Visit: Payer: Self-pay | Admitting: Physician Assistant

## 2015-06-14 DIAGNOSIS — D229 Melanocytic nevi, unspecified: Secondary | ICD-10-CM

## 2015-06-14 HISTORY — DX: Melanocytic nevi, unspecified: D22.9

## 2015-07-08 ENCOUNTER — Other Ambulatory Visit (HOSPITAL_COMMUNITY): Payer: Self-pay | Admitting: Internal Medicine

## 2015-07-08 DIAGNOSIS — Z1231 Encounter for screening mammogram for malignant neoplasm of breast: Secondary | ICD-10-CM

## 2015-07-11 ENCOUNTER — Ambulatory Visit (HOSPITAL_COMMUNITY)
Admission: RE | Admit: 2015-07-11 | Discharge: 2015-07-11 | Disposition: A | Discharge status: Home / Self Care | Payer: Medicare Other | Source: Ambulatory Visit | Attending: Internal Medicine | Admitting: Internal Medicine

## 2015-07-11 DIAGNOSIS — Z1231 Encounter for screening mammogram for malignant neoplasm of breast: Secondary | ICD-10-CM | POA: Diagnosis not present

## 2015-10-01 IMAGING — CR DG SHOULDER 2+V*L*
3 series · 3 of 3 positions shown · non-contrast
Comparison: None.

CLINICAL DATA: Left shoulder and upper arm pain

EXAM:
LEFT SHOULDER - 2+ VIEW

[view not recorded (1 of 3)]
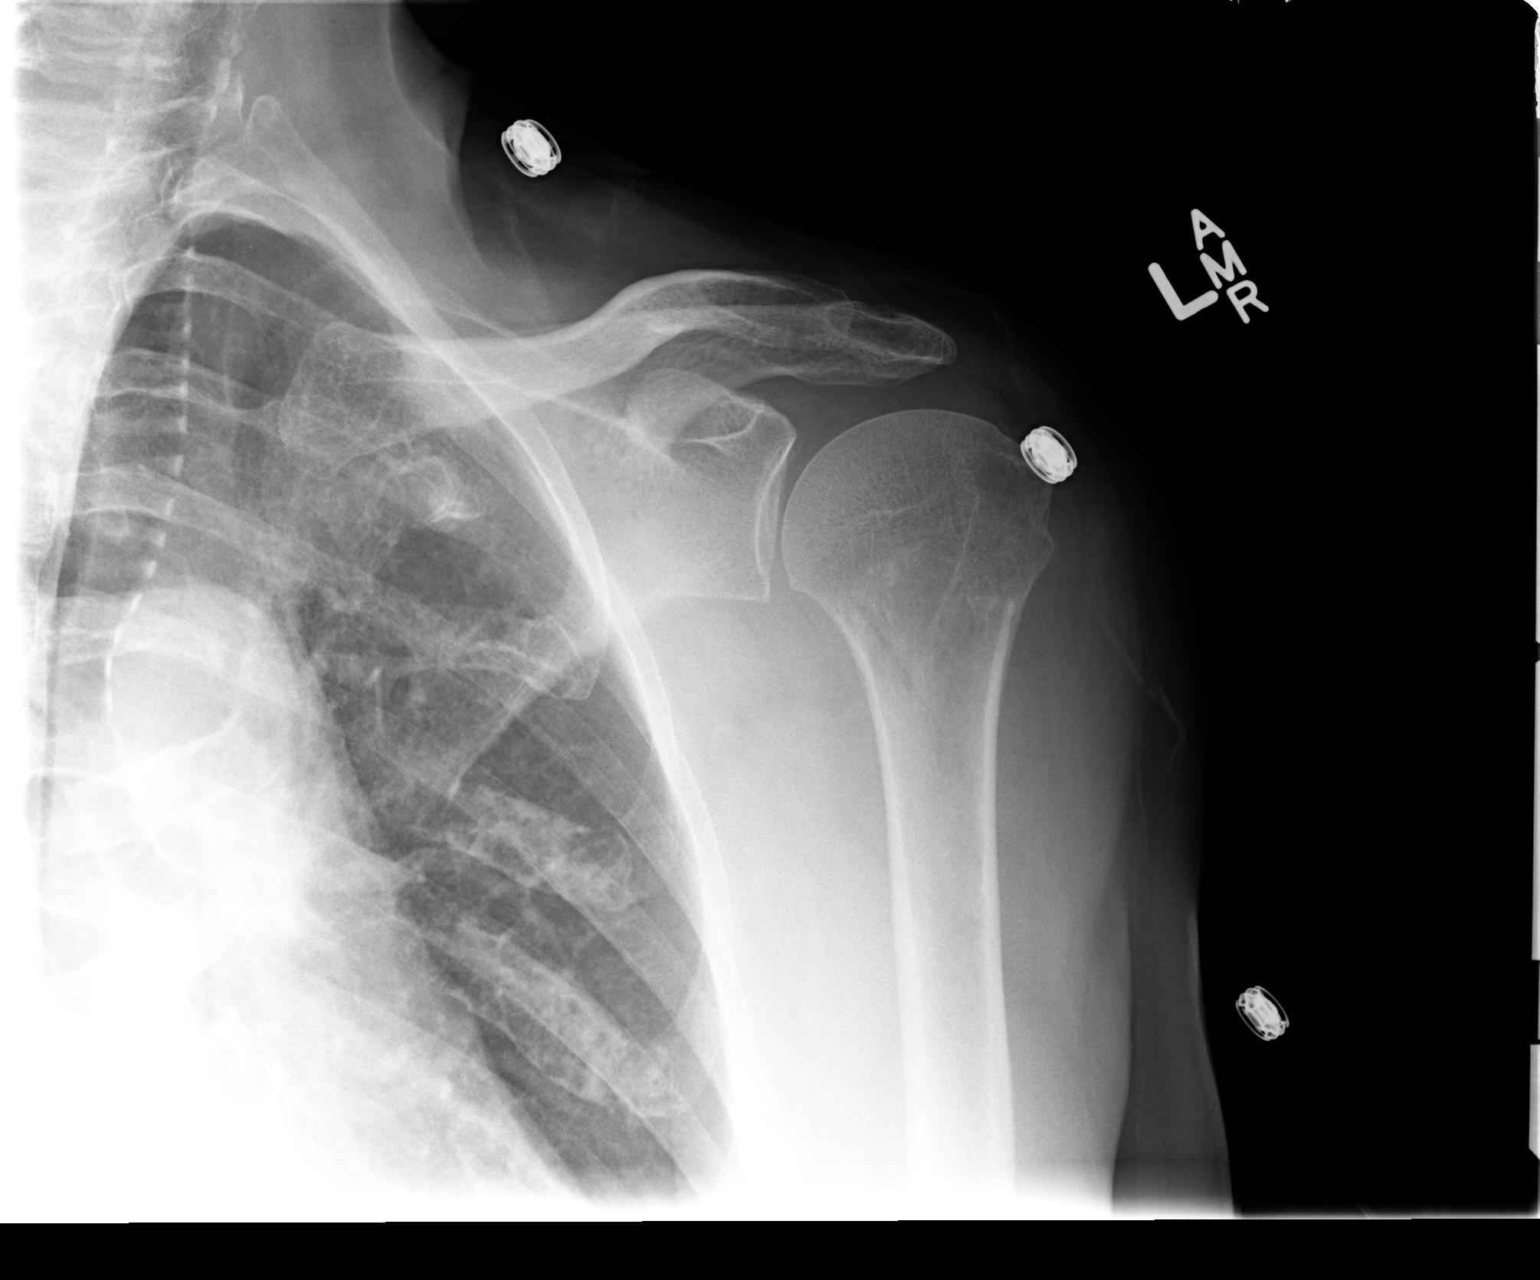

[view not recorded (2 of 3)]
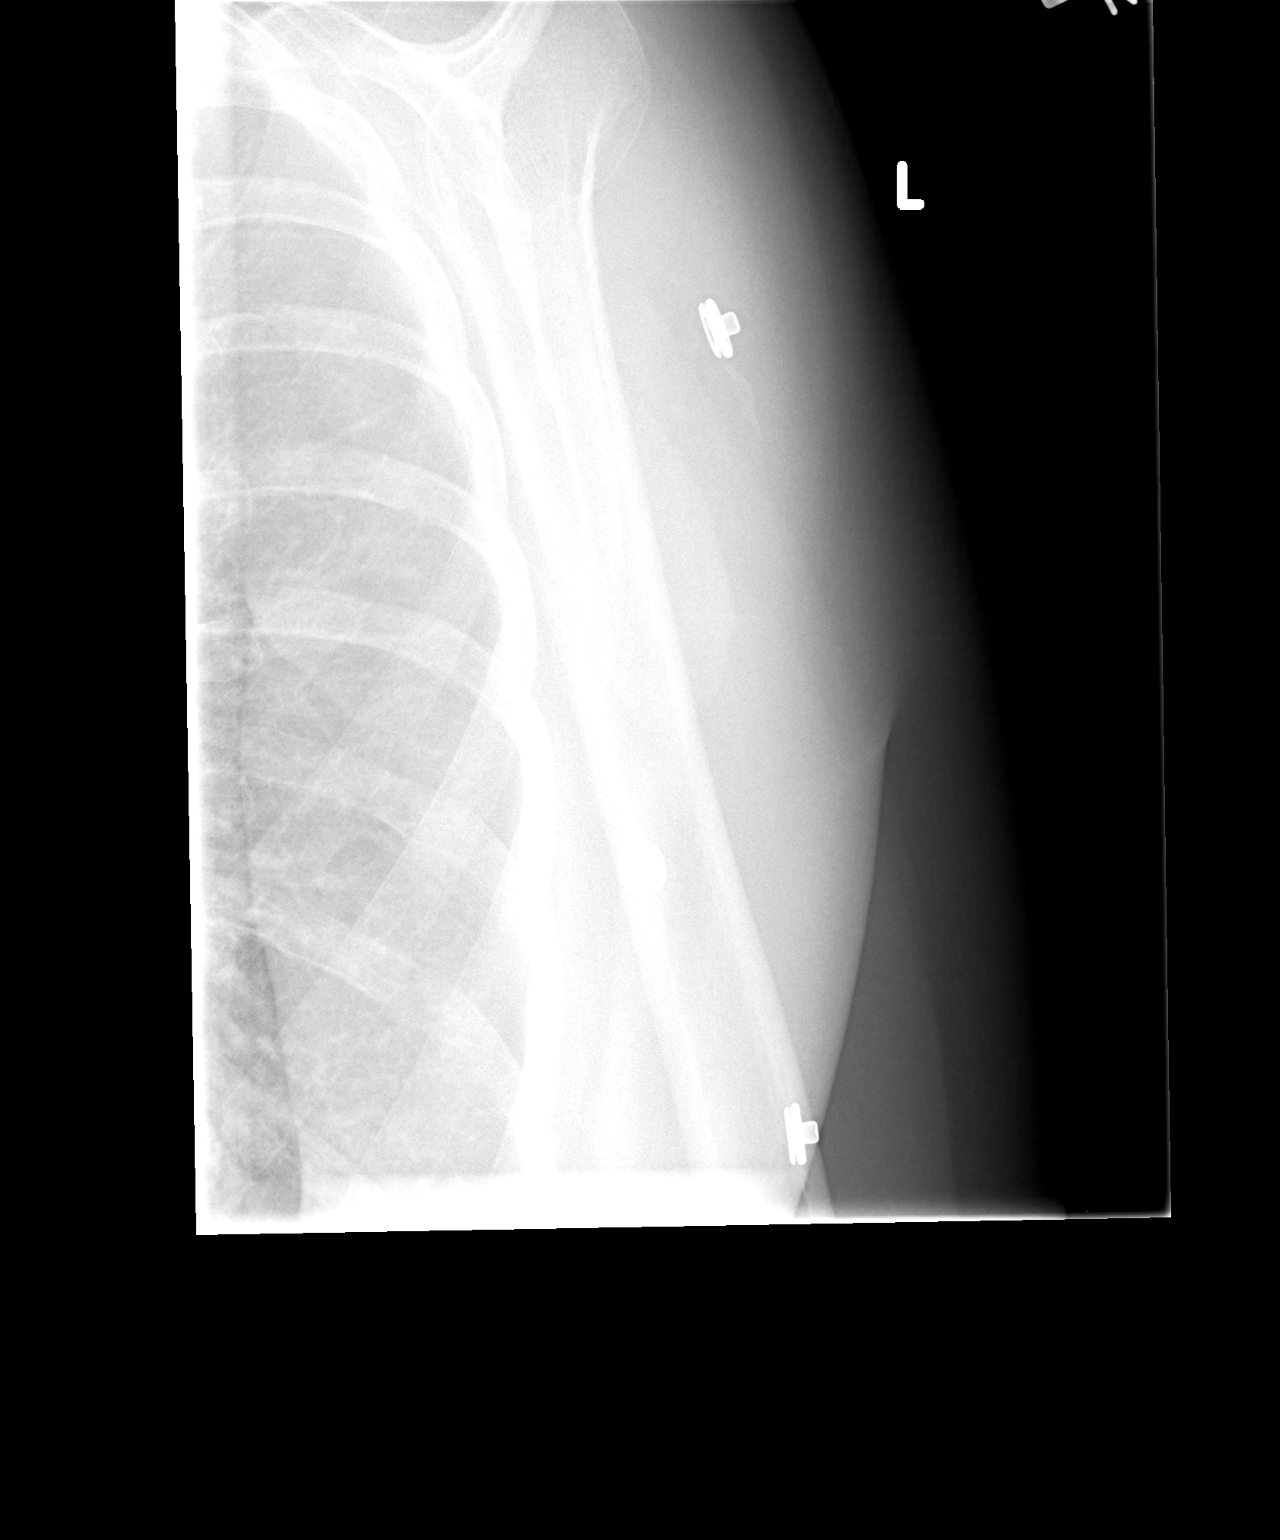

[view not recorded (3 of 3)]
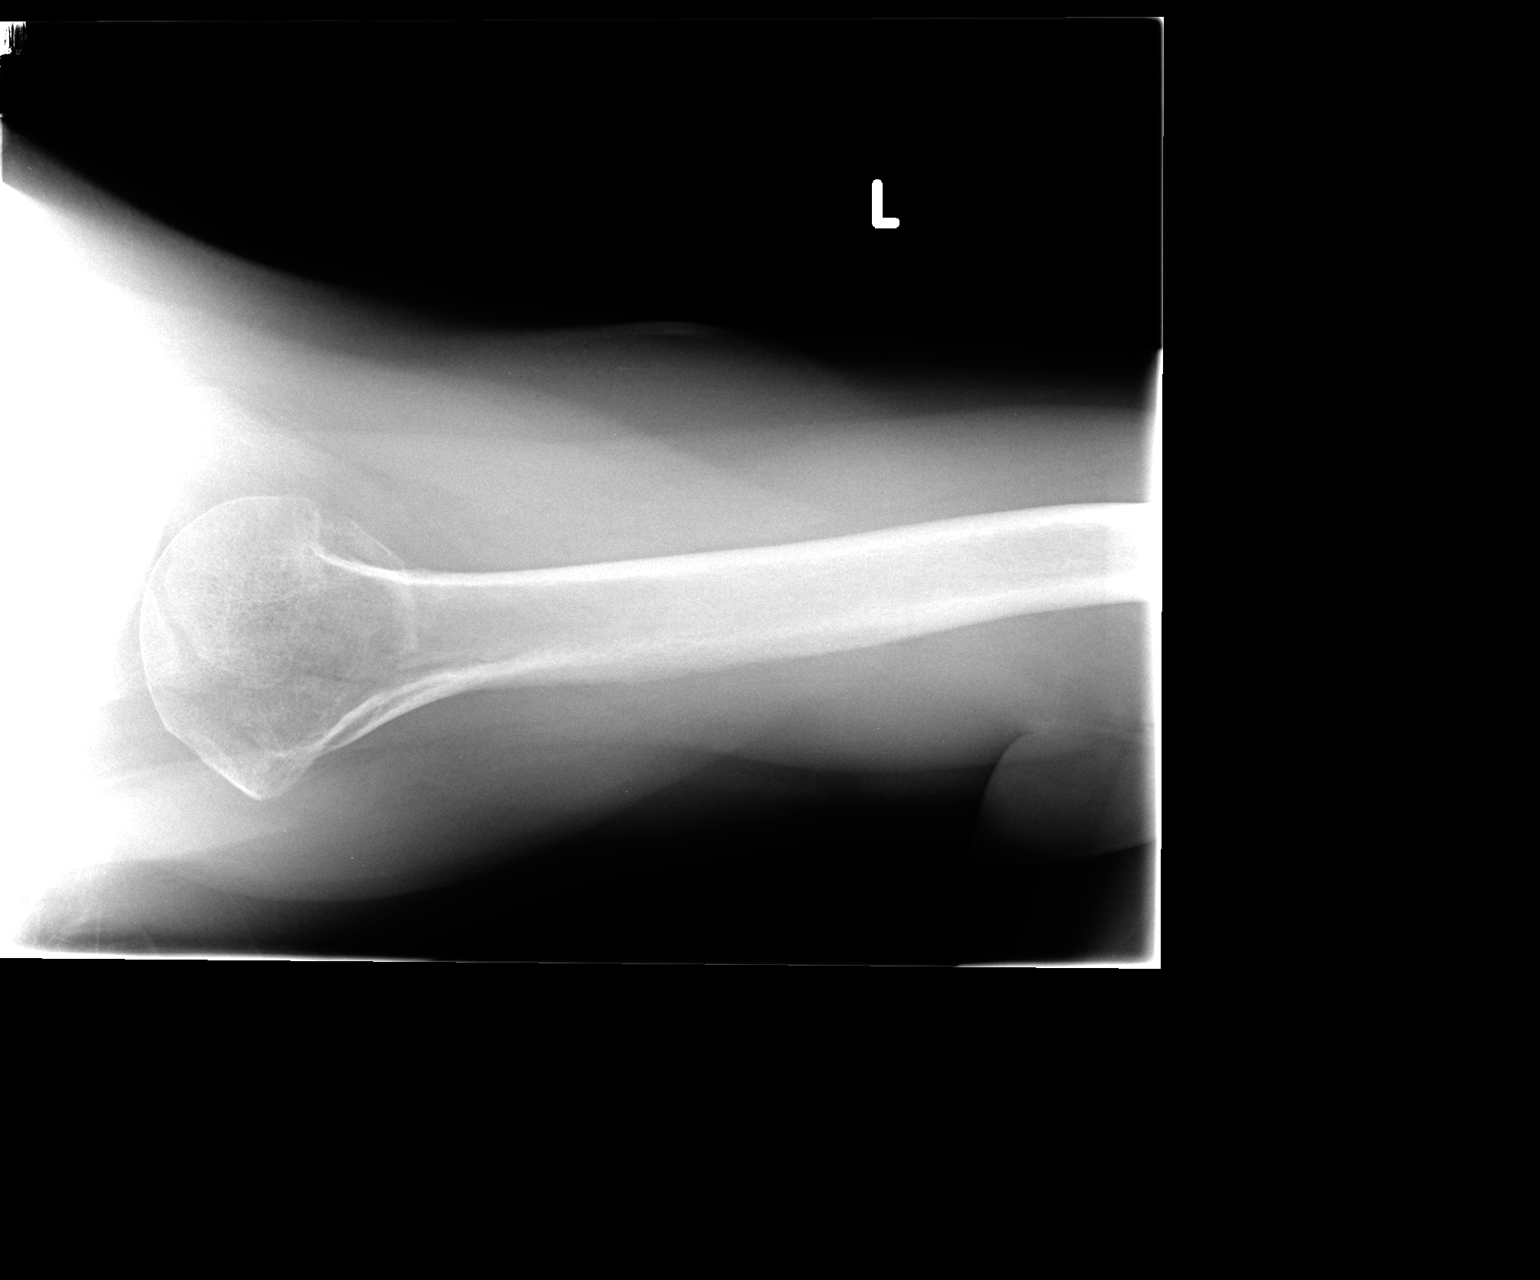

[3 of 3 positions shown; findings below may reference images not displayed]

FINDINGS: There is no fracture or dislocation. There are mild degenerative
changes of the acromioclavicular joint. There is mild osteoarthritis
of the left glenohumeral joint
IMPRESSION: No acute osseous injury of the left shoulder.

## 2015-10-01 IMAGING — CR DG HUMERUS 2V *L*
2 series · 2 of 2 positions shown · non-contrast
Comparison: Left shoulder series of today's date

CLINICAL DATA: Status post fall now with left shoulder and arm pain

EXAM:
LEFT HUMERUS - 2+ VIEW

[view not recorded (1 of 2)]
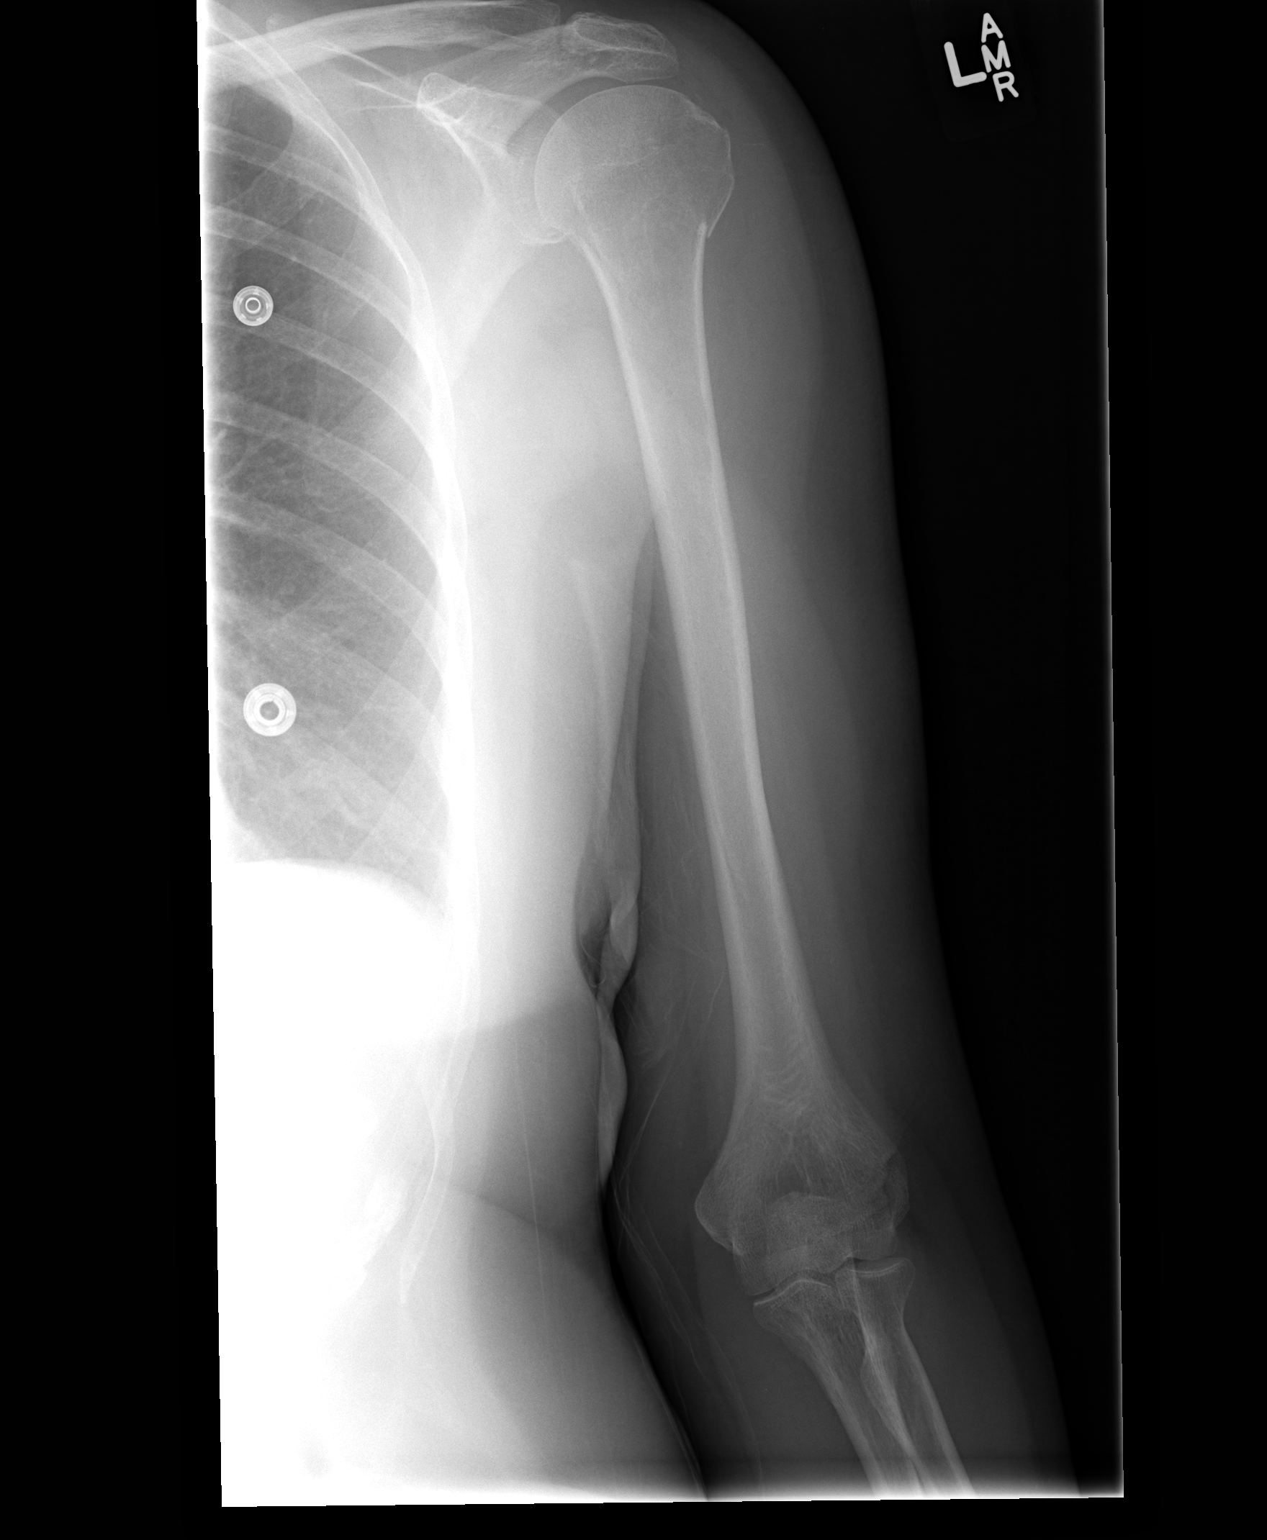

[view not recorded (2 of 2)]
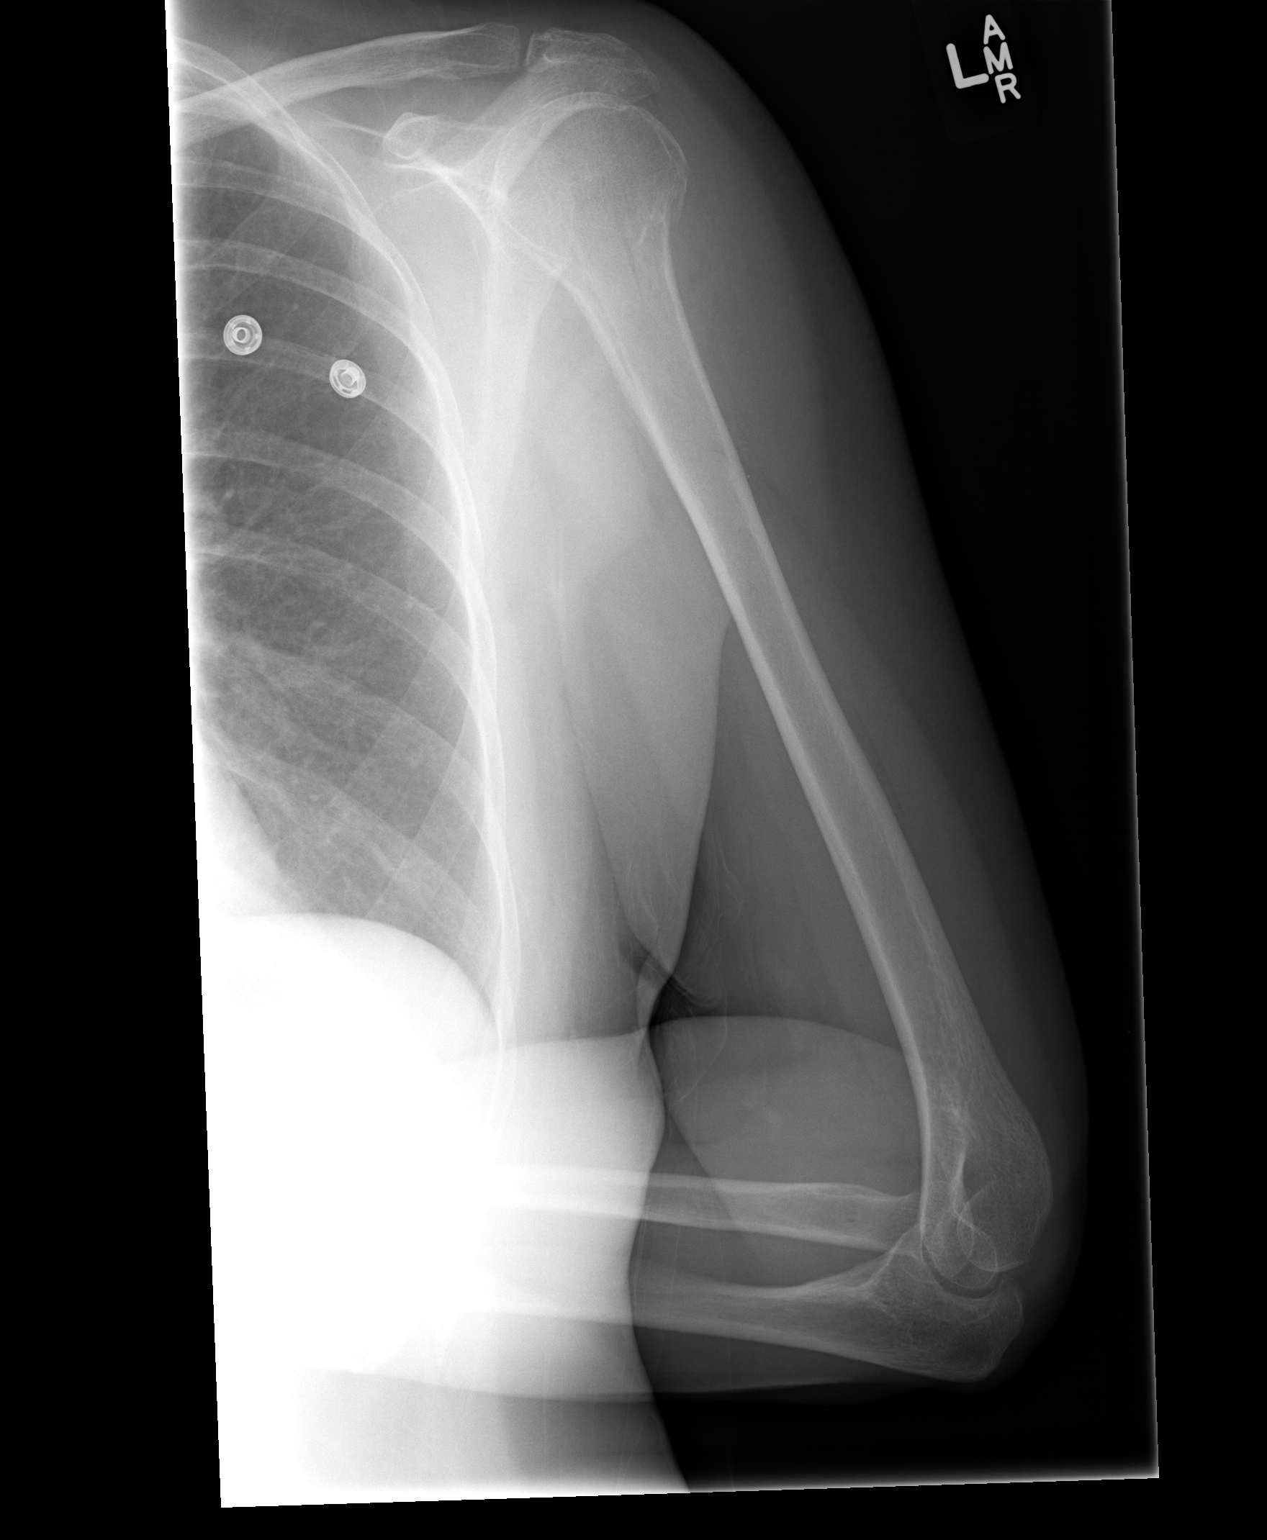

[2 of 2 positions shown; findings below may reference images not displayed]

FINDINGS: The patient has sustained a mildly impacted fracture of the neck of
the left humerus. The shaft of the humerus is intact. The observed
portions of the elbow are normal.
IMPRESSION: There is an acute mildly impacted fracture of the humeral neck.

## 2016-04-18 ENCOUNTER — Telehealth: Payer: Self-pay

## 2016-04-18 NOTE — Telephone Encounter (Signed)
LMOM for pt to call when she is ready to schedule colonoscopy.

## 2016-05-01 NOTE — Telephone Encounter (Signed)
Letter mailed to call.  

## 2016-07-18 ENCOUNTER — Other Ambulatory Visit (HOSPITAL_COMMUNITY): Payer: Self-pay | Admitting: Internal Medicine

## 2016-07-18 DIAGNOSIS — Z1231 Encounter for screening mammogram for malignant neoplasm of breast: Secondary | ICD-10-CM

## 2016-08-27 ENCOUNTER — Ambulatory Visit (HOSPITAL_COMMUNITY)
Admission: RE | Admit: 2016-08-27 | Discharge: 2016-08-27 | Disposition: A | Discharge status: Home / Self Care | Payer: Medicare Other | Source: Ambulatory Visit | Attending: Internal Medicine | Admitting: Internal Medicine

## 2016-08-27 DIAGNOSIS — Z1231 Encounter for screening mammogram for malignant neoplasm of breast: Secondary | ICD-10-CM | POA: Insufficient documentation

## 2017-07-19 ENCOUNTER — Other Ambulatory Visit (HOSPITAL_COMMUNITY): Payer: Self-pay | Admitting: Internal Medicine

## 2017-07-19 DIAGNOSIS — Z1231 Encounter for screening mammogram for malignant neoplasm of breast: Secondary | ICD-10-CM

## 2017-08-19 ENCOUNTER — Ambulatory Visit: Payer: Medicare Other | Admitting: Orthopedic Surgery

## 2017-08-19 ENCOUNTER — Encounter: Payer: Self-pay | Admitting: Orthopedic Surgery

## 2017-08-19 ENCOUNTER — Ambulatory Visit (INDEPENDENT_AMBULATORY_CARE_PROVIDER_SITE_OTHER): Payer: Medicare Other

## 2017-08-19 VITALS — BP 117/73 | HR 57 | Ht 63.0 in | Wt 142.0 lb

## 2017-08-19 DIAGNOSIS — M25551 Pain in right hip: Secondary | ICD-10-CM

## 2017-08-19 DIAGNOSIS — S72001D Fracture of unspecified part of neck of right femur, subsequent encounter for closed fracture with routine healing: Secondary | ICD-10-CM

## 2017-08-19 DIAGNOSIS — M7061 Trochanteric bursitis, right hip: Secondary | ICD-10-CM | POA: Diagnosis not present

## 2017-08-19 NOTE — Progress Notes (Signed)
NEW PATIENT OFFICE VISI  Chief Complaint  Patient presents with  . Hip Pain    Right hip pain    67 year old female presents for evaluation of right hip pain she had a right femoral neck fracture treated with cannulated screws approximately 15 years ago.  She complains of pain over the right posterior aspect of the right greater trochanter she has had it for a month or so as she is increase her exercises and walking including lateral lunges.  Is a dull mild pain not associated with any other findings   Review of Systems  Constitutional: Negative for chills, fever and weight loss.  Respiratory: Negative for shortness of breath.   Cardiovascular: Negative for chest pain.  Neurological: Negative for tingling and focal weakness.   .  No past medical history on file.  Past Surgical History:  Procedure Laterality Date  . CESAREAN SECTION    . CHOLECYSTECTOMY    . HIP SURGERY Right 2001  . OTHER SURGICAL HISTORY     history of multiple fractures.  right hip., left arm and elbow, right arm.     No family history on file. Social History   Tobacco Use  . Smoking status: Never Smoker  Substance Use Topics  . Alcohol use: No  . Drug use: No    Allergies  Allergen Reactions  . Codeine Nausea Only  . Penicillins Other (See Comments)    Childhood allergy.    No outpatient medications have been marked as taking for the 08/19/17 encounter (Office Visit) with Carole Civil, MD.    BP 117/73   Pulse (!) 57   Ht 5\' 3"  (1.6 m)   Wt 142 lb (64.4 kg)   BMI 25.15 kg/m   Physical Exam  Constitutional: She is oriented to person, place, and time. She appears well-developed and well-nourished.  Neurological: She is alert and oriented to person, place, and time.  Psychiatric: She has a normal mood and affect. Judgment normal.  Vitals reviewed.   Right Hip Exam  Right hip exam is normal.   Tenderness  The patient is experiencing tenderness in the greater  trochanter.  Range of Motion  The patient has normal right hip ROM. Right hip internal rotation: Subtle decreased right hip internal rotation.   Muscle Strength  The patient has normal right hip strength.  Tests  FABER: negative  Other  Erythema: absent Sensation: normal Pulse: present  Comments:  HIP STABILITY NORMAL   Mild tenderness right lumbar  No tenderness SI joint   Left Hip Exam  Left hip exam is normal.  Tenderness  The patient is experiencing no tenderness.   Range of Motion  The patient has normal left hip ROM.  Muscle Strength  The patient has normal left hip strength.   Tests  FABER: negative  Other  Erythema: absent Sensation: normal Pulse: present  Comments:  HIP STABILITY NORMAL   No tenderness SI joint        MEDICAL DECISION SECTION  Xrays were done at Office  My independent reading of xrays:  See report x-ray shows 3 cannulated screws no arthritis of the hip subtle femoral head abnormality only seen on the AP pelvis.  Based on clinical exam no further work-up of that is needed at this time no groin pain elicited by clinical history or examination  Encounter Diagnoses  Name Primary?  . Pain of right hip joint   . Trochanteric bursitis of right hip Yes  . Closed fracture of neck  of right femur with routine healing, subsequent encounter     PLAN: (Rx., injectx, surgery, frx, mri/ct) Recommend follow-up as needed  Home exercise program for quadricep strengthening in preparation for hiking  No orders of the defined types were placed in this encounter.   Arther Abbott, MD  08/19/2017 9:57 AM

## 2017-08-29 ENCOUNTER — Encounter (HOSPITAL_COMMUNITY): Payer: Self-pay

## 2017-08-29 ENCOUNTER — Ambulatory Visit (HOSPITAL_COMMUNITY)
Admission: RE | Admit: 2017-08-29 | Discharge: 2017-08-29 | Disposition: A | Discharge status: Home / Self Care | Payer: Medicare Other | Source: Ambulatory Visit | Attending: Internal Medicine | Admitting: Internal Medicine

## 2017-08-29 DIAGNOSIS — Z1231 Encounter for screening mammogram for malignant neoplasm of breast: Secondary | ICD-10-CM | POA: Diagnosis not present

## 2018-10-08 ENCOUNTER — Other Ambulatory Visit (HOSPITAL_COMMUNITY): Payer: Self-pay | Admitting: Internal Medicine

## 2018-10-08 DIAGNOSIS — Z1231 Encounter for screening mammogram for malignant neoplasm of breast: Secondary | ICD-10-CM

## 2018-10-30 ENCOUNTER — Other Ambulatory Visit: Payer: Self-pay

## 2018-10-30 ENCOUNTER — Ambulatory Visit (HOSPITAL_COMMUNITY)
Admission: RE | Admit: 2018-10-30 | Discharge: 2018-10-30 | Disposition: A | Discharge status: Home / Self Care | Payer: Medicare Other | Source: Ambulatory Visit | Attending: Internal Medicine | Admitting: Internal Medicine

## 2018-10-30 DIAGNOSIS — Z1231 Encounter for screening mammogram for malignant neoplasm of breast: Secondary | ICD-10-CM | POA: Diagnosis present

## 2019-01-07 DIAGNOSIS — D229 Melanocytic nevi, unspecified: Secondary | ICD-10-CM | POA: Diagnosis not present

## 2019-01-07 DIAGNOSIS — L570 Actinic keratosis: Secondary | ICD-10-CM | POA: Diagnosis not present

## 2019-01-07 DIAGNOSIS — L821 Other seborrheic keratosis: Secondary | ICD-10-CM | POA: Diagnosis not present

## 2019-01-07 DIAGNOSIS — K130 Diseases of lips: Secondary | ICD-10-CM | POA: Diagnosis not present

## 2019-03-18 ENCOUNTER — Other Ambulatory Visit: Payer: Self-pay

## 2019-03-18 ENCOUNTER — Ambulatory Visit: Payer: Medicare Other | Admitting: Physician Assistant

## 2019-03-18 ENCOUNTER — Ambulatory Visit: Payer: Medicare PPO | Admitting: *Deleted

## 2019-03-18 DIAGNOSIS — L570 Actinic keratosis: Secondary | ICD-10-CM

## 2019-03-18 MED ORDER — AMINOLEVULINIC ACID HCL 10 % EX GEL
2000.0000 mg | Freq: Once | CUTANEOUS | Status: AC
  Administered 2019-03-18: 10:00:00 2000 mg via TOPICAL

## 2019-03-18 NOTE — Patient Instructions (Signed)

## 2019-03-18 NOTE — Progress Notes (Addendum)
Photodynamic Therapy Procedure Note Diagnosis: Actinic keratosis Location: face Informed Consent: Discussed risks (burning, pain, redness, peeling, severe sunburn-like reaction, blistering, discoloration, lack of resolution) and benefits of the procedure, as well as the alternatives. Informed consent was obtained. Preparation: After cleansing the skin, the area to be treated was coated with Ameluz.  This was allowed to sit on the skin for 90 min. Procedure Details: The patient was placed under the light source with appropriate eye protection for 14minutes 42 seconds. After completing the treatment, the patient applied sunscreen to the treated areas. Patient tolerated the procedure well Plan: Avoid any sun exposure for the next 24 hours. Wear sunscreen daily for the next week. Observe normal sun precautions thereafter. Recommend OTC analgesia as needed for pain. Follow-up in 10-12 weeks. Patient here for light therapy (PDT).

## 2019-03-23 DIAGNOSIS — K146 Glossodynia: Secondary | ICD-10-CM | POA: Diagnosis not present

## 2019-04-17 DIAGNOSIS — R945 Abnormal results of liver function studies: Secondary | ICD-10-CM | POA: Diagnosis not present

## 2019-04-17 DIAGNOSIS — Z79899 Other long term (current) drug therapy: Secondary | ICD-10-CM | POA: Diagnosis not present

## 2019-04-17 DIAGNOSIS — F419 Anxiety disorder, unspecified: Secondary | ICD-10-CM | POA: Diagnosis not present

## 2019-04-17 DIAGNOSIS — E785 Hyperlipidemia, unspecified: Secondary | ICD-10-CM | POA: Diagnosis not present

## 2019-04-21 DIAGNOSIS — E785 Hyperlipidemia, unspecified: Secondary | ICD-10-CM | POA: Diagnosis not present

## 2019-04-21 DIAGNOSIS — R7301 Impaired fasting glucose: Secondary | ICD-10-CM | POA: Diagnosis not present

## 2019-04-21 DIAGNOSIS — Z0001 Encounter for general adult medical examination with abnormal findings: Secondary | ICD-10-CM | POA: Diagnosis not present

## 2019-04-21 DIAGNOSIS — Z6828 Body mass index (BMI) 28.0-28.9, adult: Secondary | ICD-10-CM | POA: Diagnosis not present

## 2019-04-21 DIAGNOSIS — K146 Glossodynia: Secondary | ICD-10-CM | POA: Diagnosis not present

## 2019-05-26 ENCOUNTER — Ambulatory Visit: Payer: Medicare PPO | Admitting: Physician Assistant

## 2019-08-04 ENCOUNTER — Ambulatory Visit: Payer: Medicare PPO | Admitting: Physician Assistant

## 2019-08-06 DIAGNOSIS — F339 Major depressive disorder, recurrent, unspecified: Secondary | ICD-10-CM | POA: Diagnosis not present

## 2019-08-20 DIAGNOSIS — H353221 Exudative age-related macular degeneration, left eye, with active choroidal neovascularization: Secondary | ICD-10-CM | POA: Diagnosis not present

## 2019-08-20 DIAGNOSIS — F339 Major depressive disorder, recurrent, unspecified: Secondary | ICD-10-CM | POA: Diagnosis not present

## 2019-08-31 DIAGNOSIS — H353221 Exudative age-related macular degeneration, left eye, with active choroidal neovascularization: Secondary | ICD-10-CM | POA: Diagnosis not present

## 2019-09-17 DIAGNOSIS — T280XXA Burn of mouth and pharynx, initial encounter: Secondary | ICD-10-CM | POA: Diagnosis not present

## 2019-09-17 DIAGNOSIS — F339 Major depressive disorder, recurrent, unspecified: Secondary | ICD-10-CM | POA: Diagnosis not present

## 2019-09-22 ENCOUNTER — Other Ambulatory Visit: Payer: Self-pay

## 2019-09-22 ENCOUNTER — Encounter: Payer: Self-pay | Admitting: Physician Assistant

## 2019-09-22 ENCOUNTER — Ambulatory Visit: Payer: Medicare PPO | Admitting: Physician Assistant

## 2019-09-22 DIAGNOSIS — Z1283 Encounter for screening for malignant neoplasm of skin: Secondary | ICD-10-CM | POA: Diagnosis not present

## 2019-09-22 DIAGNOSIS — Z86018 Personal history of other benign neoplasm: Secondary | ICD-10-CM | POA: Diagnosis not present

## 2019-09-22 DIAGNOSIS — L578 Other skin changes due to chronic exposure to nonionizing radiation: Secondary | ICD-10-CM | POA: Diagnosis not present

## 2019-09-25 NOTE — Progress Notes (Signed)
   Follow-Up Visit   Subjective  Hannah Contreras Hannah Contreras is a 69 y.o. female who presents for the following: Follow-up (PDT PEELED,  NO BLISTER).   The following portions of the chart were reviewed this encounter and updated as appropriate: Tobacco  Allergies  Meds  Problems  Med Hx  Surg Hx  Fam Hx      Objective  Well appearing patient in no apparent distress; mood and affect are within normal limits.  A focused examination was performed including face. Relevant physical exam findings are noted in the Assessment and Plan.  Objective  Head to toe: No atypical nevi No signs of non-mole skin cancer.   Objective  Right Upper Back: Scar clear  Objective  face: Face smooth. No signs of non-mole skin cancer.    Assessment & Plan  Screening exam for skin cancer Head to toe  Yearly skin exams  History of dysplastic nevus Right Upper Back  observe  Actinic skin damage face  Post PDT. Yearly skin exams    I, Delainie Chavana, PA-C, have reviewed all documentation's for this visit.  The documentation on 09/25/19 for the exam, diagnosis, procedures and orders are all accurate and complete.

## 2019-11-06 DIAGNOSIS — F339 Major depressive disorder, recurrent, unspecified: Secondary | ICD-10-CM | POA: Diagnosis not present

## 2019-11-19 ENCOUNTER — Other Ambulatory Visit (HOSPITAL_COMMUNITY): Payer: Self-pay | Admitting: Internal Medicine

## 2019-11-19 DIAGNOSIS — Z1231 Encounter for screening mammogram for malignant neoplasm of breast: Secondary | ICD-10-CM

## 2019-11-20 ENCOUNTER — Other Ambulatory Visit: Payer: Self-pay

## 2019-11-20 ENCOUNTER — Encounter: Payer: Self-pay | Admitting: Adult Health

## 2019-11-20 ENCOUNTER — Ambulatory Visit: Payer: Medicare PPO | Admitting: Adult Health

## 2019-11-20 VITALS — BP 102/60 | HR 62 | Ht 63.0 in | Wt 161.5 lb

## 2019-11-20 DIAGNOSIS — N898 Other specified noninflammatory disorders of vagina: Secondary | ICD-10-CM | POA: Diagnosis not present

## 2019-11-20 MED ORDER — PREMARIN 0.625 MG/GM VA CREA: TOPICAL_CREAM | VAGINAL | 1 refills | Status: DC

## 2019-11-20 NOTE — Progress Notes (Addendum)
  Subjective:     Patient ID: Hannah Contreras, female   DOB: 22-Aug-1950, 69 y.o.   MRN: [id number]  HPI Hannah Contreras is a 69 year old white female, married, PM in to discuss vaginal dryness. She has not had intercourse in 17 years and last pap in 2013 was painful, it was negative for malignancy and showed atrophy then. She is has friend, Hannah Contreras that uses estrogen vaginal cream and is interested in that.  Husband is not interested sex.  PCP is Dr Willey Blade.  Review of Systems +vaginal dryness Not sexually active No history of UTIs Reviewed past medical,surgical, social and family history. Reviewed medications and allergies.     Objective:   Physical Exam BP 102/60 (BP Location: Left Arm, Patient Position: Sitting, Cuff Size: Normal)   Pulse 62   Ht 5\' 3"  (1.6 m)   Wt 161 lb 8 oz (73.3 kg)   BMI 28.61 kg/m  Skin warm and dry. Neck: mid line trachea, normal thyroid, good ROM, no lymphadenopathy noted. Lungs: clear to ausculation bilaterally. Cardiovascular: regular rate and rhythm.No carotid bruits  heard. Pelvic:she declined exam  Hannah Contreras does not look 69, she looks much younger.  AA is 1 Fall risk is low PHQ 9 score is 9, no SI and is on lexapro. GAD 7 is 6   [facility] - 11/20/19 0846      Pregnancy Intention Screening   Does the patient want to become pregnant in the next year? N/A    Does the patient's partner want to become pregnant in the next year? N/A    Would the patient like to discuss contraceptive options today? N/A      Contraception Wrap Up   Current Method No Method - Other Reason   postmenopausal   End Method No Method - Other Reason   postmenopausal   Contraception Counseling Provided No             Assessment:     1. Vaginal dryness Discussed trying premarin vaginal cream, or luvena or Replens, will try PVC Meds ordered this encounter  Medications  . conjugated estrogens (PREMARIN) vaginal cream    Sig: Use 0.5 gm in vagina daily for 2 weeks at bedtime then 2-3  x weekly    Dispense:  42.5 g    Refill:  1    Order Specific Question:   Supervising Provider    Answer:   Florian Buff [[id number]]      Plan:     Follow up prn

## 2020-01-11 ENCOUNTER — Ambulatory Visit (HOSPITAL_COMMUNITY)
Admission: RE | Admit: 2020-01-11 | Discharge: 2020-01-11 | Disposition: A | Discharge status: Home / Self Care | Payer: Medicare PPO | Source: Ambulatory Visit | Attending: Internal Medicine | Admitting: Internal Medicine

## 2020-01-11 ENCOUNTER — Other Ambulatory Visit: Payer: Self-pay

## 2020-01-11 DIAGNOSIS — Z1231 Encounter for screening mammogram for malignant neoplasm of breast: Secondary | ICD-10-CM | POA: Diagnosis not present

## 2020-03-24 ENCOUNTER — Encounter: Payer: Self-pay | Admitting: Orthopedic Surgery

## 2020-03-24 ENCOUNTER — Ambulatory Visit (INDEPENDENT_AMBULATORY_CARE_PROVIDER_SITE_OTHER): Payer: Medicare PPO | Admitting: Orthopedic Surgery

## 2020-03-24 ENCOUNTER — Other Ambulatory Visit: Payer: Self-pay

## 2020-03-24 ENCOUNTER — Ambulatory Visit: Payer: Medicare PPO

## 2020-03-24 VITALS — BP 115/74 | HR 61 | Ht 63.0 in | Wt 160.0 lb

## 2020-03-24 DIAGNOSIS — M25551 Pain in right hip: Secondary | ICD-10-CM | POA: Diagnosis not present

## 2020-03-24 DIAGNOSIS — M879 Osteonecrosis, unspecified: Secondary | ICD-10-CM

## 2020-03-24 NOTE — Patient Instructions (Signed)
While we are working on your approval for MRI please go ahead and call to schedule your appointment with [facility] within at least one (1) week.   Central Scheduling [phone]  

## 2020-03-24 NOTE — Progress Notes (Unsigned)
NEW PROBLEM//OFFICE VISIT  Summary assessment and plan: Hannah Contreras seems to have some groin pain in that right hip.  There is some asymmetry in the femoral head seen on x-ray several years ago does not seem to have progressed.  However an MRI should be done to rule out an AVN or subchondral fracture  Chief Complaint  Patient presents with  . Hip Pain    Right/ history of fracture 64 years ago     70 year old female active walks frequently for exercise presents with groin pain status post ORIF right femoral neck fracture with cannulated screws about 20 years ago.  She says she had one episode where she walked about 7 miles on the beach and after that the pain started.  She complains of pain in the groin and sometimes over the outer hip near the greater trochanter.  She can lie on the right side without difficulty.  Review of systems denies any back pain   Review of Systems  All other systems reviewed and are negative.    Past Medical History:  Diagnosis Date  . 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 Depression   . Macular degeneration     Past Surgical History:  Procedure Laterality Date  . CESAREAN SECTION    . CHOLECYSTECTOMY    . HIP SURGERY Right 2001  . OTHER SURGICAL HISTORY     history of multiple fractures.  right hip., left arm and elbow, right arm.     Family History  Problem Relation Age of Onset  . Heart attack Father   . Rheum arthritis Father   . Heart disease Mother    Social History   Tobacco Use  . Smoking status: Former Smoker    Types: Cigarettes  . Smokeless tobacco: Never Used  Vaping Use  . Vaping Use: Never used  Substance Use Topics  . Alcohol use: Yes    Comment: occ  . Drug use: No    Allergies  Allergen Reactions  . Codeine Nausea Only  . Penicillins Other (See Comments)    Childhood allergy.    Current Meds  Medication Sig  . ALPRAZolam (XANAX) 0.5 MG tablet Take 0.25-0.5 mg by mouth 3 (three) times daily as  needed.  Marland Kitchen escitalopram (LEXAPRO) 10 MG tablet Take 5 mg by mouth daily.  . Multiple Vitamins-Minerals (ICAPS AREDS 2 PO) Take by mouth daily.  . [DISCONTINUED] conjugated estrogens (PREMARIN) vaginal cream Use 0.5 gm in vagina daily for 2 weeks at bedtime then 2-3 x weekly    BP 115/74   Pulse 61   Ht 5\' 3"  (1.6 m)   Wt 160 lb (72.6 kg)   BMI 28.34 kg/m   Physical Exam Musculoskeletal:     Right hip: No tenderness or crepitus. Normal strength.     Left hip: Normal. No deformity.     Right upper leg: No swelling, edema, deformity, lacerations, tenderness or bony tenderness.     Left upper leg: No swelling, edema, lacerations, tenderness or bony tenderness.     Right lower leg: Normal. No swelling. No edema.     Left lower leg: Normal. No swelling. No edema.     Comments: Right hip we do note pain with range of motion especially flexion internal rotation she has a difference in flexion on the right compared to the left, right hip less flexion than left     General appearance: Well-developed well-nourished no gross deformities  Cardiovascular normal pulse and perfusion  normal color without edema  Neurologically no sensation loss or deficits or pathologic reflexes  Psychological: Awake alert and oriented x3 mood and affect normal  Skin no lacerations or ulcerations no nodularity no palpable masses, no erythema or nodularity  Musculoskeletal:        MEDICAL DECISION MAKING  A.  Encounter Diagnosis  Name Primary?  . Pain in right hip Yes    B. DATA ANALYSED:   IMAGING: Interpretation of images: Internal images show subtle abnormality in the superior aspect of the weightbearing portion of the right femoral head similar to previous x-ray.  Cannulated screws are intact there are no washers.  No evidence of backing out of the screws.  Orders: MRI right hip  Outside records reviewed: No   C. MANAGEMENT   MRI right hip to evaluate for avascular necrosis versus  arthritis return for results  No orders of the defined types were placed in this encounter.     Arther Abbott, MD  03/24/2020 9:17 AM

## 2020-04-08 ENCOUNTER — Ambulatory Visit (HOSPITAL_COMMUNITY)
Admission: RE | Admit: 2020-04-08 | Discharge: 2020-04-08 | Disposition: A | Discharge status: Home / Self Care | Payer: Medicare PPO | Source: Ambulatory Visit | Attending: Orthopedic Surgery | Admitting: Orthopedic Surgery

## 2020-04-08 DIAGNOSIS — M8568 Other cyst of bone, other site: Secondary | ICD-10-CM | POA: Diagnosis not present

## 2020-04-08 DIAGNOSIS — M25551 Pain in right hip: Secondary | ICD-10-CM | POA: Insufficient documentation

## 2020-04-08 DIAGNOSIS — M1611 Unilateral primary osteoarthritis, right hip: Secondary | ICD-10-CM | POA: Diagnosis not present

## 2020-04-08 DIAGNOSIS — M879 Osteonecrosis, unspecified: Secondary | ICD-10-CM | POA: Insufficient documentation

## 2020-04-14 DIAGNOSIS — E785 Hyperlipidemia, unspecified: Secondary | ICD-10-CM | POA: Diagnosis not present

## 2020-04-14 DIAGNOSIS — Z79899 Other long term (current) drug therapy: Secondary | ICD-10-CM | POA: Diagnosis not present

## 2020-04-14 DIAGNOSIS — T280XXA Burn of mouth and pharynx, initial encounter: Secondary | ICD-10-CM | POA: Diagnosis not present

## 2020-04-14 DIAGNOSIS — R7301 Impaired fasting glucose: Secondary | ICD-10-CM | POA: Diagnosis not present

## 2020-04-21 ENCOUNTER — Ambulatory Visit: Payer: Medicare PPO | Admitting: Orthopedic Surgery

## 2020-04-21 ENCOUNTER — Encounter: Payer: Self-pay | Admitting: Orthopedic Surgery

## 2020-04-21 ENCOUNTER — Other Ambulatory Visit: Payer: Self-pay

## 2020-04-21 VITALS — BP 124/66 | HR 65 | Ht 63.0 in | Wt 160.0 lb

## 2020-04-21 DIAGNOSIS — M1611 Unilateral primary osteoarthritis, right hip: Secondary | ICD-10-CM

## 2020-04-21 DIAGNOSIS — Z23 Encounter for immunization: Secondary | ICD-10-CM | POA: Diagnosis not present

## 2020-04-21 DIAGNOSIS — Z6829 Body mass index (BMI) 29.0-29.9, adult: Secondary | ICD-10-CM | POA: Diagnosis not present

## 2020-04-21 DIAGNOSIS — F3340 Major depressive disorder, recurrent, in remission, unspecified: Secondary | ICD-10-CM | POA: Diagnosis not present

## 2020-04-21 DIAGNOSIS — M25551 Pain in right hip: Secondary | ICD-10-CM | POA: Diagnosis not present

## 2020-04-21 DIAGNOSIS — R001 Bradycardia, unspecified: Secondary | ICD-10-CM | POA: Diagnosis not present

## 2020-04-21 DIAGNOSIS — E785 Hyperlipidemia, unspecified: Secondary | ICD-10-CM | POA: Diagnosis not present

## 2020-04-21 NOTE — Progress Notes (Signed)
Chief Complaint  Patient presents with  . Hip Pain    Right   . Results    Review MRI scan    Chronic problem with exacerbation but for the last year, definitive interpretation   MRI has been performed and read.  After reviewing the images personally my assessment of the MRI is back she has arthritis with degenerative labral tear paralabral cyst but the primary symptoms physical exam point to arthritis as a cause of the symptoms and there is no FAI to worry about at this point  However we can follow with x-rays in 6 months if needed  In the meantime she is finished take 1 ibuprofen on an as-needed basis if it seems to handle pain  We did discuss possibly Tylenol also  Last visit Summary assessment and plan: Hannah Contreras seems to have some groin pain in that right hip.  There is some asymmetry in the femoral head seen on x-ray several years ago does not seem to have progressed.  However an MRI should be done to rule out an AVN or subchondral fracture       Chief Complaint  Patient presents with  . Hip Pain      Right/ history of fracture 61 years ago       70 year old female active walks frequently for exercise presents with groin pain status post ORIF right femoral neck fracture with cannulated screws about 20 years ago.  She says she had one episode where she walked about 7 miles on the beach and after that the pain started.  She complains of pain in the groin and sometimes over the outer hip near the greater trochanter.  She can lie on the right side without difficulty.   FINDINGS: Bones: There is no evidence of acute fracture, dislocation or avascular necrosis. Prior pinning of the right femoral neck with three cannulated screws. No focal bone lesion. Mild degenerative changes of the pubic symphysis and sacroiliac joints.   Articular cartilage and labrum   Articular cartilage: Right hip joint space narrowing and cartilage loss superiorly with subchondral marrow edema in the  acetabulum. Small subchondral cyst in the left anterior acetabulum.   Labrum: Small right anterior superior paralabral cyst (series 9, image 13). The left labrum is grossly intact.   Joint or bursal effusion   Joint effusion: No significant hip joint effusion.

## 2020-05-12 ENCOUNTER — Encounter: Payer: Self-pay | Admitting: Internal Medicine

## 2020-05-16 ENCOUNTER — Telehealth: Payer: Self-pay | Admitting: Orthopedic Surgery

## 2020-05-16 NOTE — Telephone Encounter (Signed)
Hannah Contreras called stating she is having increased hip pain.  She wants to know if Dr. Aline Brochure can do a phone visit or if she has to come in.  I told her that I would ask to see what he wants to do.  Please advise  Thanks

## 2020-05-16 NOTE — Telephone Encounter (Signed)
Please come in

## 2020-06-02 ENCOUNTER — Other Ambulatory Visit: Payer: Self-pay

## 2020-06-02 ENCOUNTER — Encounter: Payer: Self-pay | Admitting: Orthopedic Surgery

## 2020-06-02 ENCOUNTER — Ambulatory Visit (INDEPENDENT_AMBULATORY_CARE_PROVIDER_SITE_OTHER): Payer: Medicare PPO | Admitting: Orthopedic Surgery

## 2020-06-02 VITALS — BP 122/79 | HR 85 | Ht 63.0 in | Wt 160.0 lb

## 2020-06-02 DIAGNOSIS — M7061 Trochanteric bursitis, right hip: Secondary | ICD-10-CM | POA: Diagnosis not present

## 2020-06-02 NOTE — Progress Notes (Signed)
Follow-up patient request  Chief Complaint  Patient presents with  . Hip Pain    Pain has moved now to lateral hip  (previously was groin pain ) wants to know about any exercises     Patient has osteoarthritis of the right hip status post open reduction internal fixation cannulated screws right hip  Patient complains of intermittent groin pain and now intermittent pain on the lateral side of her hip over and near the greater trochanter near the previous surgical site  She is concerned about walking and has actually stopped walking for exercise  Physical Exam Constitutional:      General: She is not in acute distress.    Appearance: She is well-developed.     Comments: Well developed, well nourished Normal grooming and hygiene     Cardiovascular:     Comments: No peripheral edema Musculoskeletal:     Comments: Right hip tenderness over the greater trochanter just at its inferior margin at the superior aspect of the surgical site incision.  Otherwise she has mild pain with flexion extension of the right hip decreased external rotation  Skin:    General: Skin is warm and dry.  Neurological:     Mental Status: She is alert and oriented to person, place, and time.     Sensory: No sensory deficit.     Coordination: Coordination normal.     Gait: Gait abnormal.     Deep Tendon Reflexes: Reflexes are normal and symmetric.  Psychiatric:        Mood and Affect: Mood normal.        Behavior: Behavior normal.        Thought Content: Thought content normal.        Judgment: Judgment normal.     Comments: Affect normal     Recommend greater trochanteric bursa injection  The patient gave verbal consent for and has confirmed right greater trochanteric area as surgical site  40 mg Depo-Medrol 1% lidocaine 2 cc injected right hip area greater trochanter  No complications

## 2020-06-30 DIAGNOSIS — H353221 Exudative age-related macular degeneration, left eye, with active choroidal neovascularization: Secondary | ICD-10-CM | POA: Diagnosis not present

## 2020-07-11 DIAGNOSIS — H353221 Exudative age-related macular degeneration, left eye, with active choroidal neovascularization: Secondary | ICD-10-CM | POA: Diagnosis not present

## 2020-08-11 DIAGNOSIS — H353221 Exudative age-related macular degeneration, left eye, with active choroidal neovascularization: Secondary | ICD-10-CM | POA: Diagnosis not present

## 2020-09-16 DIAGNOSIS — H353221 Exudative age-related macular degeneration, left eye, with active choroidal neovascularization: Secondary | ICD-10-CM | POA: Diagnosis not present

## 2020-10-03 ENCOUNTER — Other Ambulatory Visit: Payer: Self-pay

## 2020-10-03 ENCOUNTER — Encounter: Payer: Self-pay | Admitting: Gastroenterology

## 2020-10-03 ENCOUNTER — Ambulatory Visit (INDEPENDENT_AMBULATORY_CARE_PROVIDER_SITE_OTHER): Payer: Medicare PPO | Admitting: Gastroenterology

## 2020-10-03 ENCOUNTER — Telehealth: Payer: Self-pay

## 2020-10-03 DIAGNOSIS — K5900 Constipation, unspecified: Secondary | ICD-10-CM | POA: Insufficient documentation

## 2020-10-03 DIAGNOSIS — Z1211 Encounter for screening for malignant neoplasm of colon: Secondary | ICD-10-CM | POA: Diagnosis not present

## 2020-10-03 MED ORDER — PEG 3350-KCL-NA BICARB-NACL 420 G PO SOLR: 4000.0000 mL | ORAL | 0 refills | Status: DC

## 2020-10-03 NOTE — Telephone Encounter (Signed)
PA for TCS submitted via [employer] website. [employer]# [id number], valid 10/25/20-11/24/20.  Pt chose Dr. Abbey Chatters to do TCS for sooner appt.

## 2020-10-03 NOTE — Patient Instructions (Signed)
Colonoscopy as scheduled. See separate instructions. Continue miralax one capful once or twice daily to maintain soft regular bowel movement. The week before your colonoscopy, you can take Linzess samples provided once daily as needed to have good BMs.

## 2020-10-03 NOTE — Progress Notes (Addendum)
Primary Care Physician:  Asencion Noble, MD  Primary Gastroenterologist:  Garfield Cornea, MD   Chief Complaint  Patient presents with   Colonoscopy    Last tcs approx 12 years ago. Negative cologuard 2 years ago.   Constipation   loose stool    HPI:  Hannah Contreras is a 70 y.o. female here at the request of Dr. Willey Blade for screening colonoscopy.  Last colonoscopy in 2003 by Dr. Gala Romney, normal.  Had Cologuard 3 years ago which was negative per Dr. Ria Comment note.  Overall patient is doing well from GI standpoint.  At baseline typically has 1 bowel movement per day, usually loose.  Couple of weeks ago she had 2 or 3 days without a stool, started MiraLAX.  Passing some small stools daily.  No melena or rectal bleeding.  Some mild lower abdominal discomfort with decreased bowel movements.  No heartburn, vomiting, unintentional weight loss.  No change in diet or medications.  Biggest concern is right hip pain, developed bursitis.  Has not been able to walk on a regular basis like she had been in the past. Used to walk 90 minutes daily.     Current Outpatient Medications  Medication Sig Dispense Refill   ALPRAZolam (XANAX) 0.5 MG tablet Take 0.25-0.5 mg by mouth 3 (three) times daily as needed.     Multiple Vitamins-Minerals (ICAPS AREDS 2 PO) Take by mouth daily.     vitamin C (ASCORBIC ACID) 500 MG tablet Take 500 mg by mouth daily.     VITAMIN D PO Take by mouth daily.     No current facility-administered medications for this visit.    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  right hip., left arm and elbow, right arm.     Family History  Problem Relation Age of Onset   Heart disease Mother    Heart attack Father    Rheum arthritis Father    Other Son        acoustic neuroma    Social History   Socioeconomic History   Marital status: Married    Spouse name: Marcello Moores   Number of children: Not on file   Years of education: Not on file   Highest education level: Not on file  Occupational History   Not on file  Tobacco Use   Smoking status: Former    Types: Cigarettes   Smokeless tobacco: Never  Vaping Use   Vaping Use: Never used  Substance and Sexual Activity   Alcohol use: Yes    Comment: occ   Drug use: No   Sexual activity: Not Currently    Birth control/protection: Post-menopausal  Other Topics Concern   Not on file  Social History Narrative   Not on file   Social Determinants of Health   Financial Resource Strain: Low Risk    Difficulty of Paying Living Expenses: Not hard at all  Food Insecurity: No Food Insecurity   Worried About  Running Out of Food in the Last Year: Never true   Ran Out of Food in the Last Year: Never true  Transportation Needs: No Transportation Needs   Lack of Transportation (Medical): No   Lack of Transportation (Non-Medical): No  Physical Activity: Sufficiently Active   Days of Exercise per Week: 5 days   Minutes of Exercise per Session: 50 min  Stress: Stress Concern Present   Feeling of Stress : To some extent  Social Connections: Engineer, building services of Communication with Friends and Family: More than three times a week   Frequency of Social Gatherings with Friends and Family: Once a week   Attends Religious Services: 1 to 4 times per year   Active Member of [employer] or Organizations: Yes   Attends Archivist Meetings: Never   Marital Status: Married  Human resources officer Violence: Not At Risk   Fear of Current or Ex-Partner: No   Emotionally Abused: No    Physically Abused: No   Sexually Abused: No      ROS:  General: Negative for anorexia, weight loss, fever, chills, fatigue, weakness. Eyes: Negative for vision changes.  ENT: Negative for hoarseness, difficulty swallowing , nasal congestion. CV: Negative for chest pain, angina, palpitations, dyspnea on exertion, peripheral edema.  Respiratory: Negative for dyspnea at rest, dyspnea on exertion, cough, sputum, wheezing.  GI: See history of present illness. GU:  Negative for dysuria, hematuria, urinary incontinence, urinary frequency, nocturnal urination.  MS: Negative for   low back pain. Right hip pain. Derm: Negative for rash or itching.  Neuro: Negative for weakness, abnormal sensation, seizure, frequent headaches, memory loss, confusion.  Psych: Negative for anxiety, depression, suicidal ideation, hallucinations.  Endo: Negative for unusual weight change.  Heme: Negative for bruising or bleeding. Allergy: Negative for rash or hives.    Physical Examination:  BP 110/74   Pulse 62   Temp (!) 97.5 F (36.4 C) (Temporal)   Ht 5\' 3"  (1.6 m)   Wt 166 lb 12.8 oz (75.7 kg)   BMI 29.55 kg/m    General: Well-nourished, well-developed in no acute distress.  Head: Normocephalic, atraumatic.   Eyes: Conjunctiva pink, no icterus. Mouth: not performed Neck: Supple without thyromegaly, masses, or lymphadenopathy.  Lungs: Clear to auscultation bilaterally.  Heart: Regular rate and rhythm, no murmurs rubs or gallops.  Abdomen: Bowel sounds are normal, nontender, nondistended, no hepatosplenomegaly or masses, no abdominal bruits or    hernia , no rebound or guarding.   Rectal: not performed Extremities: No lower extremity edema. No clubbing or deformities.  Neuro: Alert and oriented x 4 , grossly normal neurologically.  Skin: Warm and dry, no rash or jaundice.   Psych: Alert and cooperative, normal mood and affect.    Imaging Studies: No results found.   Assessment:  70 y/o  female presenting to schedule screening colonoscopy. Last colonoscopy in 2001. Cologuard 3 years ago per PCP. Overall doing well. Stools somewhat sluggish the last 1-2 weeks. Taking miralax which is helping. Plan for colonoscopy in near future with propfol (given increased dosing needed in past for conscious sedation). ASA II. She prefers Dr. Abbey Chatters to perform if Dr. Roseanne Kaufman schedule does not allow to be completed in next couple of weeks.  I have discussed the risks, alternatives, benefits with regards to but not limited to the risk of reaction to medication, bleeding, infection, perforation and the patient is agreeable to proceed. Written consent to be obtained.   Plan: Continue Miralax one or  twice daily. Samples of Linzess 145 mcg provided to take once daily as needed one week prior to colonoscopy.

## 2020-10-11 ENCOUNTER — Telehealth: Payer: Self-pay | Admitting: Internal Medicine

## 2020-10-11 NOTE — Telephone Encounter (Signed)
Pt wants to reschedule her procedure with Dr Abbey Chatters on 10/25 and said she has questions about her pre op. [phone]

## 2020-10-11 NOTE — Telephone Encounter (Signed)
Spoke to pt, TCS rescheduled to 11/18/20 at 9:00am. She's not sure she will be able to do fleet enema-advised her to take it to hospital for assistance prior to procedure. Endo scheduler informed of reschedule. New instructions mailed.

## 2020-10-11 NOTE — Telephone Encounter (Signed)
Tried to call pt, LMOVM for return call. 

## 2020-10-18 DIAGNOSIS — H353221 Exudative age-related macular degeneration, left eye, with active choroidal neovascularization: Secondary | ICD-10-CM | POA: Diagnosis not present

## 2020-10-20 ENCOUNTER — Ambulatory Visit: Payer: Medicare PPO | Admitting: Orthopedic Surgery

## 2020-10-20 ENCOUNTER — Other Ambulatory Visit: Payer: Self-pay

## 2020-10-20 ENCOUNTER — Encounter: Payer: Self-pay | Admitting: Orthopedic Surgery

## 2020-10-20 VITALS — BP 128/75 | HR 72 | Ht 63.0 in | Wt 165.0 lb

## 2020-10-20 DIAGNOSIS — M1611 Unilateral primary osteoarthritis, right hip: Secondary | ICD-10-CM | POA: Diagnosis not present

## 2020-10-20 DIAGNOSIS — M7061 Trochanteric bursitis, right hip: Secondary | ICD-10-CM

## 2020-10-20 NOTE — Progress Notes (Signed)
EVALUATION AND MANAGEMENT   Type of appointment : Follow-up  PLAN: CONTINUE NON OP CARE   CONTINUE TUMERIC SUPPLEMENTS  START PT FOR OA HIP   RETURN 6 MONTHS FOR XRAYS   No orders of the defined types were placed in this encounter.    Chief Complaint  Patient presents with   Hip Pain    Right wants to go for physical therapy taking some herbal supplements helps some     70 year old female who had cannulated screw fixation of her right hip fracture years ago.  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  She also was treated for greater trochanteric bursitis with an injection  She is here for 26-month follow-up  SHE HAS STARTED A NEW TUMERIC BASED SUPPLEMENTS AND IT SEEMS TO BE CONTROLLING HER GROIN PAIN; SHES ABLE TO WALK NOW BUT DOES C/O DIFFICULTY PUTTING ON HER SOCK AND SHOES AND BENDING FROM THE WAIST   Images previously obtained include  X-ray  Status post ORIF right hip for femoral neck fracture approaching 20 years ago   AP pelvis shows 3 screws right hip in an inverted triangle fashion fracture healed   As noted in the x-ray taken 2019 there is a subtle abnormality of the femoral head   Cannot rule out AVN in this area.   No distinct leg length abnormality   Impression hardware without complication possible AVN femoral head  MRI  IMPRESSION: 1. Prior right femoral neck pinning with moderate post-traumatic right hip osteoarthritis. 2. Small right anterior superior paralabral cyst, suspicious for underlying labral tear.     Electronically Signed   By: Titus Dubin M.D.   On: 04/09/2020 10:59  Review of Systems  Respiratory: Negative.    Cardiovascular: Negative.     Body mass index is 29.23 kg/m.  Physical Exam Constitutional:      General: She is not in acute distress.    Appearance: She is well-developed.     Comments: Well developed, well  nourished Normal grooming and hygiene     Cardiovascular:     Comments: No peripheral edema Musculoskeletal:     Right hip: No deformity, lacerations, tenderness, bony tenderness or crepitus. Decreased range of motion. Normal strength.     Left hip: Normal.     Comments: WHEN THE HIP FLEXES THE LEG GOES INTO EXTERNAL ROTATION BUT THE FLEXION IS = TO THE LEFT; PAIN IS NOTED WITH IR     Skin:    General: Skin is warm and dry.  Neurological:     Mental Status: She is alert and oriented to person, place, and time.     Sensory: No sensory deficit.     Coordination: Coordination normal.     Gait: Gait normal.     Deep Tendon Reflexes: Reflexes are normal and symmetric.  Psychiatric:        Mood and Affect: Mood normal.        Behavior: Behavior normal.        Thought Content: Thought content normal.        Judgment: Judgment normal.     Comments: Affect normal     Past Medical History:  Diagnosis Date   Anxiety    Atypical nevus 06/14/2015   Right Post Shoulder - Mild   Depression    Macular degeneration    Past Surgical History:  Procedure Laterality Date   CESAREAN SECTION  CHOLECYSTECTOMY     COLONOSCOPY     normal   HIP SURGERY Right 01/02/1999   OTHER SURGICAL HISTORY     history of multiple fractures.  right hip., left arm and elbow, right arm.    Social History   Tobacco Use   Smoking status: Former    Types: Cigarettes   Smokeless tobacco: Never  Vaping Use   Vaping Use: Never used  Substance Use Topics   Alcohol use: Yes    Comment: occ   Drug use: No     Assessment and Plan:  Encounter Diagnoses  Name Primary?   Arthritis of right hip Yes   Greater trochanteric bursitis of right hip    PLAN: CONTINUE NON OP CARE   CONTINUE TUMERIC SUPPLEMENTS  START PT FOR OA HIP   RETURN 6 MONTHS FOR XRAYS

## 2020-10-31 DIAGNOSIS — Z23 Encounter for immunization: Secondary | ICD-10-CM | POA: Diagnosis not present

## 2020-11-03 ENCOUNTER — Encounter (HOSPITAL_COMMUNITY): Payer: Self-pay | Admitting: Physical Therapy

## 2020-11-03 ENCOUNTER — Ambulatory Visit (HOSPITAL_COMMUNITY): Payer: Medicare PPO | Attending: Orthopedic Surgery | Admitting: Physical Therapy

## 2020-11-03 ENCOUNTER — Other Ambulatory Visit: Payer: Self-pay

## 2020-11-03 DIAGNOSIS — M1611 Unilateral primary osteoarthritis, right hip: Secondary | ICD-10-CM | POA: Diagnosis not present

## 2020-11-03 DIAGNOSIS — M25551 Pain in right hip: Secondary | ICD-10-CM | POA: Diagnosis not present

## 2020-11-03 DIAGNOSIS — M7061 Trochanteric bursitis, right hip: Secondary | ICD-10-CM | POA: Insufficient documentation

## 2020-11-03 NOTE — Therapy (Signed)
[place], [place], [address] Phone: [phone]   Fax:  [phone]  Physical Therapy Evaluation  Patient Details  Name: Hannah Contreras MRN: [id number] Date of Birth: [birth year] Referring Provider (PT): Arther Abbott MD   Encounter Date: 11/03/2020   PT End of Session - 11/03/20 0942     Visit Number 1    Number of Visits 8    Date for PT Re-Evaluation 12/01/20    Authorization Type [employer] medicare    Authorization Time Period check auth    PT Start Time 0900    PT Stop Time 0945    PT Time Calculation (min) 45 min    Activity Tolerance Patient tolerated treatment well    Behavior During Therapy [facility] for tasks assessed/performed             Past Medical History:  Diagnosis Date   Anxiety    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  right hip., left arm and elbow, right arm.     There were no vitals filed for this visit.    Subjective Assessment - 11/03/20 0908     Subjective Patient presents to therapy with complaint of RT hip pain. she reports history of RT hip fracture about 20 years ago. She reports this has not bothered her until about 9 months ago. No known mechanics otherwise. Pain has gotten progressively worse with walking. Is taking a multivitamin which seems to help. No prescription medications. She has had a steroid injection but results were temporary.    Limitations Lifting;Standing;Walking    How long can you walk comfortably? 30 minutes    Patient Stated Goals Knowing what stretches and exercises to do    Currently in Pain? No/denies                [facility] PT Assessment - 11/03/20 0001       Assessment    Medical Diagnosis RT hip pain/ arthritis    Referring Provider (PT) Arther Abbott MD    Prior Therapy Yes      Precautions   Precautions None      Restrictions   Weight Bearing Restrictions No      Balance Screen   Has the patient fallen in the past 6 months No      [place] residence      Prior Function   Level of Independence Independent      Cognition   Overall Cognitive Status Within Functional Limits for tasks assessed      Observation/Other Assessments   Focus on Therapeutic Outcomes (FOTO)  67% function      ROM / Strength   AROM / PROM / Strength AROM;Strength      AROM   AROM Assessment Site Hip    Right/Left Hip Right;Left    Right Hip Extension 5    Right Hip Flexion 90   groin pain   Left Hip Extension 5    Left Hip Flexion 110      Strength   Strength Assessment Site Knee;Hip    Right/Left Hip  Right;Left    Right Hip Flexion 4+/5    Right Hip Extension 4+/5    Right Hip ABduction 4/5    Left Hip Flexion 5/5    Left Hip Extension 4+/5    Left Hip ABduction 4+/5    Right/Left Knee Right;Left    Right Knee Extension 4+/5    Left Knee Extension 5/5      Palpation   Palpation comment min/mod TTP about RT greater trochanter                        Objective measurements completed on examination: See above findings.       [facility] Adult PT Treatment/Exercise - 11/03/20 0001       Exercises   Exercises Knee/Hip      Knee/Hip Exercises: Supine   Bridges 5 reps    Bridges Limitations 5" holds    Other Supine Knee/Hip Exercises iso hip flexion RT 5 x 5"                     PT Education - 11/03/20 0917     Education Details on evalution findings, POC and HEP    Person(s) Educated Patient    Methods Explanation;Handout    Comprehension Verbalized understanding              PT Short Term Goals - 11/03/20 1327       PT SHORT TERM GOAL #1   Title Patient will be  independent with initial HEP and self-management strategies to improve functional outcomes    Time 2    Period Weeks    Status New    Target Date 11/17/20               PT Long Term Goals - 11/03/20 1327       PT LONG TERM GOAL #1   Title Patient will be independent with advance HEP and self-management strategies to improve functional outcomes    Time 4    Period Weeks    Status New    Target Date 12/01/20      PT LONG TERM GOAL #2   Title Patient will improve FOTO score to predicted value to indicate improvement in functional outcomes    Time 4    Period Weeks    Status New    Target Date 12/01/20      PT LONG TERM GOAL #3   Title Patient will report at least 65% overall improvement in subjective complaint to indicate improvement in ability to perform ADLs.    Time 4    Period Weeks    Status New    Target Date 12/01/20      PT LONG TERM GOAL #4   Title Patient will be able to walk for exercise up to 45 minutes with hip pain not to exceed 3/10 for improved functional outcomes and QOL    Baseline current 30 minutes with 7/10 pain    Time 4    Period Weeks    Status New    Target Date 12/01/20                    Plan - 11/03/20 1324     Clinical Impression Statement Patient is a 70 y.o. female who presents to physical therapy with complaint of RT hip pain. Patient demonstrates decreased strength, increased tenderness to palpation and ROM restriction which are likely contributing to symptoms of pain and are  negatively impacting patient ability to perform ADLs and functional mobility tasks. Patient will benefit from skilled physical therapy services to address these deficits to reduce pain, improve level of function with ADLs and functional mobility tasks.    Examination-Activity Limitations Stand;Locomotion Level;Stairs;Squat    Examination-Participation Restrictions Cleaning;Yard Work;Laundry;Shop;Community Activity    Stability/Clinical Decision Making  Stable/Uncomplicated    Clinical Decision Making Low    Rehab Potential Good    PT Frequency 2x / week    PT Duration 4 weeks    PT Treatment/Interventions ADLs/Self Care Home Management;Biofeedback;Cryotherapy;Ultrasound;Traction;Functional mobility training;Neuromuscular re-education;Stair training;Gait training;Iontophoresis 4mg /ml Dexamethasone;DME Instruction;Contrast Bath;Electrical Stimulation;Therapeutic exercise;Orthotic Fit/Training;Taping;Compression bandaging;Balance training;Scar mobilization;Passive range of motion;Manual techniques;Therapeutic activities;Patient/family education;Parrafin;Fluidtherapy;Manual lymph drainage;Energy conservation;Vasopneumatic Device;Visual/perceptual remediation/compensation;Spinal Manipulations;Joint Manipulations;Splinting;Dry needling;Moist Heat    PT Next Visit Plan Review HEP. Hip traction, lateral joint mobs for improved hip flexion, progress core and glute strength as tolerated. Clams, iso hip adduction, abduction, self mobs with mobility band, squats    PT Home Exercise Plan Eval: iso hip flexion, bridge    Consulted and Agree with Plan of Care Patient             Patient will benefit from skilled therapeutic intervention in order to improve the following deficits and impairments:  Pain, Improper body mechanics, Increased fascial restricitons, Impaired flexibility, Decreased range of motion, Decreased activity tolerance, Decreased strength, Hypomobility, Decreased mobility  Visit Diagnosis: Pain in right hip     Problem List Patient Active Problem List   Diagnosis Date Noted   Encounter for screening for malignant neoplasm of colon 10/03/2020   Muscle weakness (generalized) 08/27/2013   Decreased range of motion (ROM) of shoulder 08/27/2013   Fracture of proximal end of left humerus 08/04/2013   SHOULDER PAIN 02/16/2009   IMPINGEMENT SYNDROME 02/16/2009   1:34 PM, 11/03/20 Josue Hector PT DPT  Physical Therapist with [facility]  [phone] [place], [place], [address] Phone: [phone]   Fax:  [phone]  Name: Caryl Fate MRN: [id number] Date of Birth: 1950-08-14

## 2020-11-09 ENCOUNTER — Encounter (HOSPITAL_COMMUNITY): Payer: Self-pay | Admitting: Physical Therapy

## 2020-11-09 ENCOUNTER — Ambulatory Visit (HOSPITAL_COMMUNITY): Payer: Medicare PPO | Admitting: Physical Therapy

## 2020-11-09 ENCOUNTER — Other Ambulatory Visit: Payer: Self-pay

## 2020-11-09 DIAGNOSIS — M25551 Pain in right hip: Secondary | ICD-10-CM | POA: Diagnosis not present

## 2020-11-09 DIAGNOSIS — M7061 Trochanteric bursitis, right hip: Secondary | ICD-10-CM | POA: Diagnosis not present

## 2020-11-09 DIAGNOSIS — M1611 Unilateral primary osteoarthritis, right hip: Secondary | ICD-10-CM | POA: Diagnosis not present

## 2020-11-09 NOTE — Patient Instructions (Signed)
Access Code: [email or web address] URL: [email or web address] Date: 11/09/2020 Prepared by: Mitzi Hansen Adilee Lemme  Exercises Hooklying Isometric Hip Abduction with Belt - 1 x daily - 7 x weekly - 10 reps - 10 second hold Supine Hip Adduction Isometric with Ball - 1 x daily - 7 x weekly - 10 reps - 10 second hold

## 2020-11-09 NOTE — Therapy (Signed)
[place], [place], [address] Phone: [phone]   Fax:  [phone]  Physical Therapy Treatment  Patient Details  Name: Hannah Contreras MRN: [id number] Date of Birth: Jan 15, 1950 Referring Provider (PT): Arther Abbott MD   Encounter Date: 11/09/2020   PT End of Session - 11/09/20 0915     Visit Number 2    Number of Visits 8    Date for PT Re-Evaluation 12/01/20    Authorization Type [employer] medicare    Authorization Time Period [place] approved  PT 8 visits  11/3/-12/01/2020    Authorization - Visit Number 1    Authorization - Number of Visits 8    PT Start Time 0915    PT Stop Time 0955    PT Time Calculation (min) 40 min    Activity Tolerance Patient tolerated treatment well    Behavior During Therapy [facility] for tasks assessed/performed             Past Medical History:  Diagnosis Date   Anxiety    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  right hip., left arm and elbow, right arm.     There were no vitals filed for this visit.   Subjective Assessment - 11/09/20 0916     Subjective Patient states hip is doing alright. Exercises are going alright. She can walk for about 30 minutes and following continues to be achy.    Limitations Lifting;Standing;Walking    How long can you walk comfortably? 30 minutes    Patient Stated Goals Knowing what stretches and exercises to do    Currently in Pain? No/denies                               [facility] Adult PT Treatment/Exercise - 11/09/20 0001       Knee/Hip Exercises: Seated   Other Seated Knee/Hip Exercises hip ER/Flexion stretch 10 x 5 second holds      Knee/Hip  Exercises: Supine   Bridges 10 reps    Bridges Limitations 5" holds    Other Supine Knee/Hip Exercises iso hip flexion RT 10 x 5"    Other Supine Knee/Hip Exercises hip abd/ add iso with ball 10x 10 second holds      Knee/Hip Exercises: Sidelying   Clams 1x20 RLE      Knee/Hip Exercises: Prone   Hip Extension Both;2 sets;10 reps      Manual Therapy   Manual Therapy Joint mobilization    Manual therapy comments completed independently from all other aspects of treatment    Joint Mobilization lateral hip distraction 5x 1 minute, lateral distraction mobs grade III                     PT Education - 11/09/20 0916     Education Details HEP    Person(s) Educated Patient    Methods Explanation;Demonstration    Comprehension Verbalized understanding;Returned demonstration              PT Short Term Goals - 11/03/20 1327  PT SHORT TERM GOAL #1   Title Patient will be independent with initial HEP and self-management strategies to improve functional outcomes    Time 2    Period Weeks    Status New    Target Date 11/17/20               PT Long Term Goals - 11/03/20 1327       PT LONG TERM GOAL #1   Title Patient will be independent with advance HEP and self-management strategies to improve functional outcomes    Time 4    Period Weeks    Status New    Target Date 12/01/20      PT LONG TERM GOAL #2   Title Patient will improve FOTO score to predicted value to indicate improvement in functional outcomes    Time 4    Period Weeks    Status New    Target Date 12/01/20      PT LONG TERM GOAL #3   Title Patient will report at least 65% overall improvement in subjective complaint to indicate improvement in ability to perform ADLs.    Time 4    Period Weeks    Status New    Target Date 12/01/20      PT LONG TERM GOAL #4   Title Patient will be able to walk for exercise up to 45 minutes with hip pain not to exceed 3/10 for improved functional  outcomes and QOL    Baseline current 30 minutes with 7/10 pain    Time 4    Period Weeks    Status New    Target Date 12/01/20                   Plan - 11/09/20 0915     Clinical Impression Statement Patient with good mechanics with previously competed exercises without cueing. Continued with hip strengthening which patient tolerates well without increase in symptoms. Began lateral hip distraction in 90-90 position along with lateral mobilization both with belt. Patient tolerates well with slight improvement in flexion ROM and ease of functional ER/flexion. Educated on probable muscle soreness. Patient will continue to benefit from skilled physical therapy in order to reduce impairment and improve function.    Examination-Activity Limitations Stand;Locomotion Level;Stairs;Squat    Examination-Participation Restrictions Cleaning;Yard Work;Laundry;Shop;Community Activity    Stability/Clinical Decision Making Stable/Uncomplicated    Rehab Potential Good    PT Frequency 2x / week    PT Duration 4 weeks    PT Treatment/Interventions ADLs/Self Care Home Management;Biofeedback;Cryotherapy;Ultrasound;Traction;Functional mobility training;Neuromuscular re-education;Stair training;Gait training;Iontophoresis 4mg /ml Dexamethasone;DME Instruction;Contrast Bath;Electrical Stimulation;Therapeutic exercise;Orthotic Fit/Training;Taping;Compression bandaging;Balance training;Scar mobilization;Passive range of motion;Manual techniques;Therapeutic activities;Patient/family education;Parrafin;Fluidtherapy;Manual lymph drainage;Energy conservation;Vasopneumatic Device;Visual/perceptual remediation/compensation;Spinal Manipulations;Joint Manipulations;Splinting;Dry needling;Moist Heat    PT Next Visit Plan Hip traction, lateral joint mobs for improved hip flexion, progress core and glute strength as tolerated.  self mobs with mobility band, squats    PT Home Exercise Plan Eval: iso hip flexion, bridge 10/9  hip abd/add iso    Consulted and Agree with Plan of Care Patient             Patient will benefit from skilled therapeutic intervention in order to improve the following deficits and impairments:  Pain, Improper body mechanics, Increased fascial restricitons, Impaired flexibility, Decreased range of motion, Decreased activity tolerance, Decreased strength, Hypomobility, Decreased mobility  Visit Diagnosis: Pain in right hip     Problem List Patient Active Problem List   Diagnosis Date Noted   Encounter  for screening for malignant neoplasm of colon 10/03/2020   Muscle weakness (generalized) 08/27/2013   Decreased range of motion (ROM) of shoulder 08/27/2013   Fracture of proximal end of left humerus 08/04/2013   SHOULDER PAIN 02/16/2009   IMPINGEMENT SYNDROME 02/16/2009    10:00 AM, 11/09/20 Mearl Latin PT, DPT Physical Therapist at [place], [place], [address] Phone: [phone]   Fax:  [phone]  Name: Jamaia Brum MRN: [id number] Date of Birth: July 24, 1950

## 2020-11-10 ENCOUNTER — Encounter (HOSPITAL_COMMUNITY): Payer: Self-pay | Admitting: Physical Therapy

## 2020-11-10 ENCOUNTER — Ambulatory Visit (HOSPITAL_COMMUNITY): Payer: Medicare PPO | Admitting: Physical Therapy

## 2020-11-10 DIAGNOSIS — M1611 Unilateral primary osteoarthritis, right hip: Secondary | ICD-10-CM | POA: Diagnosis not present

## 2020-11-10 DIAGNOSIS — M25551 Pain in right hip: Secondary | ICD-10-CM | POA: Diagnosis not present

## 2020-11-10 DIAGNOSIS — M7061 Trochanteric bursitis, right hip: Secondary | ICD-10-CM | POA: Diagnosis not present

## 2020-11-10 NOTE — Therapy (Signed)
[place], [place], [address] Phone: [phone]   Fax:  [phone]  Physical Therapy Treatment  Patient Details  Name: Hannah Contreras MRN: [id number] Date of Birth: [birth year] Referring Provider (PT): Arther Abbott MD   Encounter Date: 11/10/2020   PT End of Session - 11/10/20 0952     Visit Number 3    Number of Visits 8    Date for PT Re-Evaluation 12/01/20    Authorization Type [employer] medicare    Authorization Time Period Cohere approved  PT 8 visits  11/3/-12/01/2020    Authorization - Visit Number 2    Authorization - Number of Visits 8    PT Start Time [phone]    PT Stop Time 1038    PT Time Calculation (min) 43 min    Activity Tolerance Patient tolerated treatment well    Behavior During Therapy [facility] for tasks assessed/performed             Past Medical History:  Diagnosis Date   Anxiety    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  right hip., left arm and elbow, right arm.     There were no vitals filed for this visit.   Subjective Assessment - 11/10/20 0953     Subjective Patient states some soreness on inside of her leg. She has been doing her exercises.    Limitations Lifting;Standing;Walking    How long can you walk comfortably? 30 minutes    Patient Stated Goals Knowing what stretches and exercises to do    Currently in Pain? No/denies                               [facility] Adult PT Treatment/Exercise - 11/10/20 0001       Knee/Hip Exercises: Standing   SLS 3x 30 second holds bilateral    Other Standing Knee Exercises lateral stepping 4x 15 feet bilateral green band      Knee/Hip  Exercises: Seated   Sit to Sand 10 reps;2 sets      Knee/Hip Exercises: Sidelying   Hip ABduction Right;2 sets;10 reps      Knee/Hip Exercises: Prone   Hip Extension Both;2 sets;10 reps      Manual Therapy   Manual Therapy Joint mobilization    Manual therapy comments completed independently from all other aspects of treatment    Joint Mobilization lateral hip distraction 5x 1 minute, lateral distraction mobs grade III                     PT Education - 11/10/20 0952     Education Details HEP    Person(s) Educated Patient    Methods Explanation;Demonstration    Comprehension Verbalized understanding;Returned demonstration              PT Short Term Goals - 11/03/20 1327       PT SHORT TERM GOAL #1   Title Patient will be independent with initial HEP and self-management strategies to improve functional outcomes    Time 2  Period Weeks    Status New    Target Date 11/17/20               PT Long Term Goals - 11/03/20 1327       PT LONG TERM GOAL #1   Title Patient will be independent with advance HEP and self-management strategies to improve functional outcomes    Time 4    Period Weeks    Status New    Target Date 12/01/20      PT LONG TERM GOAL #2   Title Patient will improve FOTO score to predicted value to indicate improvement in functional outcomes    Time 4    Period Weeks    Status New    Target Date 12/01/20      PT LONG TERM GOAL #3   Title Patient will report at least 65% overall improvement in subjective complaint to indicate improvement in ability to perform ADLs.    Time 4    Period Weeks    Status New    Target Date 12/01/20      PT LONG TERM GOAL #4   Title Patient will be able to walk for exercise up to 45 minutes with hip pain not to exceed 3/10 for improved functional outcomes and QOL    Baseline current 30 minutes with 7/10 pain    Time 4    Period Weeks    Status New    Target Date 12/01/20                    Plan - 11/10/20 5701     Clinical Impression Statement Began session with hip mobilizations for improving flexion and reducing joint compression. Continued with glute strengthening which is tolerated well. Patient educated on hip pathology and muscle shortening/weakness. Patient will continue to benefit from skilled physical therapy in order to reduce impairment and improve function.    Examination-Activity Limitations Stand;Locomotion Level;Stairs;Squat    Examination-Participation Restrictions Cleaning;Yard Work;Laundry;Shop;Community Activity    Stability/Clinical Decision Making Stable/Uncomplicated    Rehab Potential Good    PT Frequency 2x / week    PT Duration 4 weeks    PT Treatment/Interventions ADLs/Self Care Home Management;Biofeedback;Cryotherapy;Ultrasound;Traction;Functional mobility training;Neuromuscular re-education;Stair training;Gait training;Iontophoresis 4mg /ml Dexamethasone;DME Instruction;Contrast Bath;Electrical Stimulation;Therapeutic exercise;Orthotic Fit/Training;Taping;Compression bandaging;Balance training;Scar mobilization;Passive range of motion;Manual techniques;Therapeutic activities;Patient/family education;Parrafin;Fluidtherapy;Manual lymph drainage;Energy conservation;Vasopneumatic Device;Visual/perceptual remediation/compensation;Spinal Manipulations;Joint Manipulations;Splinting;Dry needling;Moist Heat    PT Next Visit Plan Hip traction, lateral joint mobs for improved hip flexion, progress core and glute strength as tolerated.  self mobs with mobility band, squats    PT Home Exercise Plan Eval: iso hip flexion, bridge 10/9 hip abd/add iso 11/10 hip abd, hip extension, STS, lateral stepping    Consulted and Agree with Plan of Care Patient             Patient will benefit from skilled therapeutic intervention in order to improve the following deficits and impairments:  Pain, Improper body mechanics, Increased fascial restricitons, Impaired  flexibility, Decreased range of motion, Decreased activity tolerance, Decreased strength, Hypomobility, Decreased mobility  Visit Diagnosis: Pain in right hip     Problem List Patient Active Problem List   Diagnosis Date Noted   Encounter for screening for malignant neoplasm of colon 10/03/2020   Muscle weakness (generalized) 08/27/2013   Decreased range of motion (ROM) of shoulder 08/27/2013   Fracture of proximal end of left humerus 08/04/2013   SHOULDER PAIN 02/16/2009   IMPINGEMENT SYNDROME 02/16/2009   10:41 AM, 11/10/20  Mearl Latin PT, DPT Physical Therapist at [place], [place], [address] Phone: [phone]   Fax:  [phone]  Name: Xandria Gallaga MRN: [id number] Date of Birth: July 27, 1950

## 2020-11-10 NOTE — Patient Instructions (Signed)
Access Code: [email or web address] URL: [email or web address] Date: 11/10/2020 Prepared by: Mitzi Hansen Nannie Starzyk  Exercises Prone Hip Extension - 1 x daily - 7 x weekly - 2 sets - 10 reps Sidelying Hip Abduction - 1 x daily - 7 x weekly - 2 sets - 10 reps Sit to Stand with Arms Crossed - 1 x daily - 7 x weekly - 2 sets - 10 reps Side Stepping with Resistance at Ankles - 1 x daily - 7 x weekly - 3 sets - 10 reps

## 2020-11-15 ENCOUNTER — Ambulatory Visit (HOSPITAL_COMMUNITY): Payer: Medicare PPO | Admitting: Physical Therapy

## 2020-11-15 ENCOUNTER — Other Ambulatory Visit: Payer: Self-pay

## 2020-11-15 DIAGNOSIS — M25551 Pain in right hip: Secondary | ICD-10-CM

## 2020-11-15 DIAGNOSIS — M1611 Unilateral primary osteoarthritis, right hip: Secondary | ICD-10-CM | POA: Diagnosis not present

## 2020-11-15 DIAGNOSIS — M7061 Trochanteric bursitis, right hip: Secondary | ICD-10-CM | POA: Diagnosis not present

## 2020-11-15 NOTE — Therapy (Signed)
[facility] [address] [place], [place], [address] Phone: [phone]   Fax:  [phone]  Physical Therapy Treatment  Patient Details  Name: Hannah Contreras MRN: [id number] Date of Birth: 1950/11/11 Referring Provider (PT): Arther Abbott MD   Encounter Date: 11/15/2020   PT End of Session - 11/15/20 1013     Visit Number 4    Number of Visits 8    Date for PT Re-Evaluation 12/01/20    Authorization Type [employer] medic[employer]    Authorization Time Period Cohere approved  PT 8 visits  11/3/-12/01/2020    Authorization - Visit Number 3    Authorization - Number of Visits 8    PT Start Time 0910    PT Stop Time 0949    PT Time Calculation (min) 39 min    Activity Tolerance Patient tolerated treatment well    Behavior During Therapy [facility] for tasks assessed/performed             Past Medical History:  Diagnosis Date   Anxiety    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  right hip., left arm and elbow, right arm.     There were no vitals filed for this visit.   Subjective Assessment - 11/15/20 0911     Subjective Pt states no pain today.  Has some aching with increased activity.    Currently in Pain? No/denies                               [facility] Adult PT Treatment/Exercise - 11/15/20 0001       Knee/Hip Exercises: Standing   Heel Raises Both;10 reps    SLS with Vectors 5X5" each with 1 HHA      Knee/Hip Exercises: Seated   Sit to Sand 10 reps;2 sets      Knee/Hip Exercises: Supine   Bridges 10 reps    Bridges Limitations 5" holds    Bridges with [employer] Both;10 reps    Straight Leg Raises Both;10 reps      Knee/Hip  Exercises: Sidelying   Hip ABduction Right;2 sets;10 reps      Knee/Hip Exercises: Prone   Hip Extension Both;2 sets;10 reps    Other Prone Exercises heelsqueezes 10X5"                       PT Short Term Goals - 11/03/20 1327       PT SHORT TERM GOAL #1   Title Patient will be independent with initial HEP and self-management strategies to improve functional outcomes    Time 2    Period Weeks    Status New    Target Date 11/17/20               PT Long Term Goals - 11/03/20 1327       PT LONG TERM GOAL #1   Title Patient will be independent with advance HEP and self-management strategies to improve functional outcomes    Time 4    Period Weeks  Status New    Target Date 12/01/20      PT LONG TERM GOAL #2   Title Patient will improve FOTO score to predicted value to indicate improvement in functional outcomes    Time 4    Period Weeks    Status New    Target Date 12/01/20      PT LONG TERM GOAL #3   Title Patient will report at least 65% overall improvement in subjective complaint to indicate improvement in ability to perform ADLs.    Time 4    Period Weeks    Status New    Target Date 12/01/20      PT LONG TERM GOAL #4   Title Patient will be able to walk for exercise up to 45 minutes with hip pain not to exceed 3/10 for improved functional outcomes and QOL    Baseline current 30 minutes with 7/10 pain    Time 4    Period Weeks    Status New    Target Date 12/01/20                   Plan - 11/15/20 1014     Clinical Impression Statement Pt returns today verbalizing overall improvement.  Began with addition of heelraises and vector stance.  Shown increased challenge for bridge activity adding increased holds, add squeeze and alternating march while in bridge stance.  Pt reported challenge with all activities.  SLR added to help improve hip/LE strength with weakness/challenge verbalized.  No new exercises added to HEP this session.     Examination-Activity Limitations Stand;Locomotion Level;Stairs;Squat    Examination-Participation Restrictions Cleaning;Yard Work;Laundry;Shop;Community Activity    Stability/Clinical Decision Making Stable/Uncomplicated    Rehab Potential Good    PT Frequency 2x / week    PT Duration 4 weeks    PT Treatment/Interventions ADLs/Self Care Home Management;Biofeedback;Cryotherapy;Ultrasound;Traction;Functional mobility training;Neuromuscular re-education;Stair training;Gait training;Iontophoresis 4mg /ml Dexamethasone;DME Instruction;Contrast Bath;Electrical Stimulation;Therapeutic exercise;Orthotic Fit/Training;Taping;Compression bandaging;Balance training;Scar mobilization;Passive range of motion;Manual techniques;Therapeutic activities;Patient/family education;Parrafin;Fluidtherapy;Manual lymph drainage;Energy conservation;Vasopneumatic Device;Visual/perceptual remediation/compensation;Spinal Manipulations;Joint Manipulations;Splinting;Dry needling;Moist Heat    PT Next Visit Plan manual as needed, i.e. Hip traction, lateral joint mobs for improved hip flexion, progress core and glute strength as tolerated.  self mobs with mobility band, squats    PT Home Exercise Plan Eval: iso hip flexion, bridge 10/9 hip abd/add iso 11/10 hip abd, hip extension, STS, lateral stepping    Consulted and Agree with Plan of Care Patient             Patient will benefit from skilled therapeutic intervention in order to improve the following deficits and impairments:  Pain, Improper body mechanics, Increased fascial restricitons, Impaired flexibility, Decreased range of motion, Decreased activity tolerance, Decreased strength, Hypomobility, Decreased mobility  Visit Diagnosis: Pain in right hip     Problem List Patient Active Problem List   Diagnosis Date Noted   Encounter for screening for malignant neoplasm of colon 10/03/2020   Muscle weakness (generalized) 08/27/2013   Decreased range of motion (ROM)  of shoulder 08/27/2013   Fracture of proximal end of left humerus 08/04/2013   SHOULDER PAIN 02/16/2009   IMPINGEMENT SYNDROME 02/16/2009   Teena Irani, PTA/CLT, WTA [phone]  Teena Irani, PTA 11/15/2020, 10:15 AM  [facility] [address] [place], [place], [address] Phone: [phone]   Fax:  [phone]  Name: Hannah Contreras MRN: [id number] Date of Birth: 05/10/1950

## 2020-11-17 ENCOUNTER — Other Ambulatory Visit: Payer: Self-pay

## 2020-11-17 ENCOUNTER — Ambulatory Visit (HOSPITAL_COMMUNITY): Payer: Medicare PPO | Admitting: Physical Therapy

## 2020-11-17 DIAGNOSIS — M7061 Trochanteric bursitis, right hip: Secondary | ICD-10-CM | POA: Diagnosis not present

## 2020-11-17 DIAGNOSIS — M25551 Pain in right hip: Secondary | ICD-10-CM

## 2020-11-17 DIAGNOSIS — M1611 Unilateral primary osteoarthritis, right hip: Secondary | ICD-10-CM | POA: Diagnosis not present

## 2020-11-17 NOTE — Therapy (Signed)
[facility] [place], [place], [address] Phone: [phone]   Fax:  [phone]  Physical Therapy Treatment  Patient Details  Name: Hannah Contreras MRN: [id number] Date of Birth: [birth year] Referring Provider (PT): Arther Abbott MD   Encounter Date: 11/17/2020   PT End of Session - 11/17/20 1216     Visit Number 5    Number of Visits 8    Date for PT Re-Evaluation 12/01/20    Authorization Type [employer] medicare    Authorization Time Period Cohere approved  PT 8 visits  11/3/-12/01/2020    Authorization - Visit Number 4    Authorization - Number of Visits 8    PT Start Time [phone]    PT Stop Time 1000    PT Time Calculation (min) 42 min    Activity Tolerance Patient tolerated treatment well    Behavior During Therapy [facility] for tasks assessed/performed             Past Medical History:  Diagnosis Date   Anxiety    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  right hip., left arm and elbow, right arm.     There were no vitals filed for this visit.   Subjective Assessment - 11/17/20 0917     Subjective pt states some improvement.  Really not having any pain currently. STates it really only bothers her when she's up doing alot, especially stepping activities.    Currently in Pain? No/denies                               [facility] Adult PT Treatment/Exercise - 11/17/20 0001       Knee/Hip Exercises: Stretches   Piriformis Stretch Both;2 reps;60 seconds    Piriformis Stretch Limitations seated      Knee/Hip Exercises: Standing   Heel Raises Both;15 reps    SLS with Vectors 5X5" each with 1 HHA      Knee/Hip Exercises: Seated    Sit to Sand 10 reps;without UE support      Knee/Hip Exercises: Supine   Bridges 15 reps    Bridges Limitations 5" holds                       PT Short Term Goals - 11/03/20 1327       PT SHORT TERM GOAL #1   Title Patient will be independent with initial HEP and self-management strategies to improve functional outcomes    Time 2    Period Weeks    Status New    Target Date 11/17/20               PT Long Term Goals - 11/03/20 1327       PT LONG TERM GOAL #1   Title Patient will be independent with advance HEP and self-management strategies to improve functional outcomes    Time 4    Period Weeks    Status New    Target Date 12/01/20      PT LONG TERM GOAL #2  Title Patient will improve FOTO score to predicted value to indicate improvement in functional outcomes    Time 4    Period Weeks    Status New    Target Date 12/01/20      PT LONG TERM GOAL #3   Title Patient will report at least 65% overall improvement in subjective complaint to indicate improvement in ability to perform ADLs.    Time 4    Period Weeks    Status New    Target Date 12/01/20      PT LONG TERM GOAL #4   Title Patient will be able to walk for exercise up to 45 minutes with hip pain not to exceed 3/10 for improved functional outcomes and QOL    Baseline current 30 minutes with 7/10 pain    Time 4    Period Weeks    Status New    Target Date 12/01/20                   Plan - 11/17/20 1216     Clinical Impression Statement Continued with LE strengthening with addition of piriformis stretch this session.  Pt with tightness in Rt, minimal tightness in Lt.   Pt with Also educated on core stabilization with patient able to complete this with minimal cues and good breathing during contractions.  Pt overall improving and pleased with progress thus far.    Examination-Activity Limitations Stand;Locomotion Level;Stairs;Squat    Examination-Participation Restrictions  Cleaning;Yard Work;Laundry;Shop;Community Activity    Stability/Clinical Decision Making Stable/Uncomplicated    Rehab Potential Good    PT Frequency 2x / week    PT Duration 4 weeks    PT Treatment/Interventions ADLs/Self Care Home Management;Biofeedback;Cryotherapy;Ultrasound;Traction;Functional mobility training;Neuromuscular re-education;Stair training;Gait training;Iontophoresis 4mg /ml Dexamethasone;DME Instruction;Contrast Bath;Electrical Stimulation;Therapeutic exercise;Orthotic Fit/Training;Taping;Compression bandaging;Balance training;Scar mobilization;Passive range of motion;Manual techniques;Therapeutic activities;Patient/family education;Parrafin;Fluidtherapy;Manual lymph drainage;Energy conservation;Vasopneumatic Device;Visual/perceptual remediation/compensation;Spinal Manipulations;Joint Manipulations;Splinting;Dry needling;Moist Heat    PT Next Visit Plan manual as needed, i.e. Hip traction, lateral joint mobs for improved hip flexion, progress core and glute strength as tolerated.  self mobs with mobility band, squats    PT Home Exercise Plan Eval: iso hip flexion, bridge 10/9 hip abd/add iso 11/10 hip abd, hip extension, STS, lateral stepping    Consulted and Agree with Plan of Care Patient             Patient will benefit from skilled therapeutic intervention in order to improve the following deficits and impairments:  Pain, Improper body mechanics, Increased fascial restricitons, Impaired flexibility, Decreased range of motion, Decreased activity tolerance, Decreased strength, Hypomobility, Decreased mobility  Visit Diagnosis: Pain in right hip     Problem List Patient Active Problem List   Diagnosis Date Noted   Encounter for screening for malignant neoplasm of colon 10/03/2020   Muscle weakness (generalized) 08/27/2013   Decreased range of motion (ROM) of shoulder 08/27/2013   Fracture of proximal end of left humerus 08/04/2013   SHOULDER PAIN 02/16/2009    IMPINGEMENT SYNDROME 02/16/2009   Teena Irani, PTA/CLT, WTA [phone]  Teena Irani, PTA 11/17/2020, 12:18 PM  [facility] [address] [place], [place], [address] Phone: [phone]   Fax:  [phone]  Name: Hannah Contreras MRN: [id number] Date of Birth: 1950-03-05

## 2020-11-17 NOTE — Patient Instructions (Signed)
Piriformis Stretch, Sitting    Sit, one ankle on opposite knee, same-side hand on crossed knee. Push down on knee, keeping spine straight. Lean torso forward, with flat back, until tension is felt in hamstrings and gluteals of crossed-leg side. Hold _60__ seconds.  Repeat _3__ times per session. Do _3__ sessions per day.

## 2020-11-18 ENCOUNTER — Other Ambulatory Visit: Payer: Self-pay

## 2020-11-18 ENCOUNTER — Ambulatory Visit (HOSPITAL_COMMUNITY): Payer: Medicare PPO | Admitting: Anesthesiology

## 2020-11-18 ENCOUNTER — Ambulatory Visit (HOSPITAL_COMMUNITY)
Admission: RE | Admit: 2020-11-18 | Discharge: 2020-11-18 | Disposition: A | Discharge status: Home / Self Care | Payer: Medicare PPO | Attending: Internal Medicine | Admitting: Internal Medicine

## 2020-11-18 ENCOUNTER — Encounter (HOSPITAL_COMMUNITY): Payer: Self-pay

## 2020-11-18 ENCOUNTER — Encounter (HOSPITAL_COMMUNITY)
Admission: RE | Disposition: A | Discharge status: Home / Self Care | Payer: Self-pay | Source: Home / Self Care | Attending: Internal Medicine

## 2020-11-18 DIAGNOSIS — Z87891 Personal history of nicotine dependence: Secondary | ICD-10-CM | POA: Insufficient documentation

## 2020-11-18 DIAGNOSIS — Z1211 Encounter for screening for malignant neoplasm of colon: Secondary | ICD-10-CM

## 2020-11-18 DIAGNOSIS — F418 Other specified anxiety disorders: Secondary | ICD-10-CM | POA: Diagnosis not present

## 2020-11-18 DIAGNOSIS — F32A Depression, unspecified: Secondary | ICD-10-CM | POA: Insufficient documentation

## 2020-11-18 DIAGNOSIS — K5730 Diverticulosis of large intestine without perforation or abscess without bleeding: Secondary | ICD-10-CM | POA: Insufficient documentation

## 2020-11-18 DIAGNOSIS — K648 Other hemorrhoids: Secondary | ICD-10-CM | POA: Diagnosis not present

## 2020-11-18 DIAGNOSIS — F419 Anxiety disorder, unspecified: Secondary | ICD-10-CM | POA: Insufficient documentation

## 2020-11-18 HISTORY — PX: COLONOSCOPY WITH PROPOFOL: SHX5780

## 2020-11-18 SURGERY — COLONOSCOPY WITH PROPOFOL
Anesthesia: General

## 2020-11-18 MED ORDER — LACTATED RINGERS IV SOLN
INTRAVENOUS | Status: DC
  Administered 2020-11-18: 1000 mL via INTRAVENOUS

## 2020-11-18 MED ORDER — PROPOFOL 500 MG/50ML IV EMUL
INTRAVENOUS | Status: DC | PRN
Start: 2020-11-18 — End: 2020-11-18
  Administered 2020-11-18: 150 ug/kg/min via INTRAVENOUS

## 2020-11-18 MED ORDER — LIDOCAINE HCL (PF) 2 % IJ SOLN
INTRAMUSCULAR | Status: AC
  Filled 2020-11-18: qty 5

## 2020-11-18 MED ORDER — PROPOFOL 10 MG/ML IV BOLUS
INTRAVENOUS | Status: DC | PRN
  Administered 2020-11-18: 50 mg via INTRAVENOUS
  Administered 2020-11-18: 20 mg via INTRAVENOUS

## 2020-11-18 MED ORDER — PROPOFOL 1000 MG/100ML IV EMUL
INTRAVENOUS | Status: AC
  Filled 2020-11-18: qty 100

## 2020-11-18 NOTE — Discharge Instructions (Addendum)
Colonoscopy Discharge Instructions  Read the instructions outlined below and refer to this sheet in the next few weeks. These discharge instructions provide you with general information on caring for yourself after you leave the hospital. Your doctor may also give you specific instructions. While your treatment has been planned according to the most current medical practices available, unavoidable complications occasionally occur.   ACTIVITY You may resume your regular activity, but move at a slower pace for the next 24 hours.  Take frequent rest periods for the next 24 hours.  Walking will help get rid of the air and reduce the bloated feeling in your belly (abdomen).  No driving for 24 hours (because of the medicine (anesthesia) used during the test).   Do not sign any important legal documents or operate any machinery for 24 hours (because of the anesthesia used during the test).  NUTRITION Drink plenty of fluids.  You may resume your normal diet as instructed by your doctor.  Begin with a light meal and progress to your normal diet. Heavy or fried foods are harder to digest and may make you feel sick to your stomach (nauseated).  Avoid alcoholic beverages for 24 hours or as instructed.  MEDICATIONS You may resume your normal medications unless your doctor tells you otherwise.  WHAT YOU CAN EXPECT TODAY Some feelings of bloating in the abdomen.  Passage of more gas than usual.  Spotting of blood in your stool or on the toilet paper.  IF YOU HAD POLYPS REMOVED DURING THE COLONOSCOPY: No aspirin products for 7 days or as instructed.  No alcohol for 7 days or as instructed.  Eat a soft diet for the next 24 hours.  FINDING OUT THE RESULTS OF YOUR TEST Not all test results are available during your visit. If your test results are not back during the visit, make an appointment with your caregiver to find out the results. Do not assume everything is normal if you have not heard from your  caregiver or the medical facility. It is important for you to follow up on all of your test results.  SEEK IMMEDIATE MEDICAL ATTENTION IF: You have more than a spotting of blood in your stool.  Your belly is swollen (abdominal distention).  You are nauseated or vomiting.  You have a temperature over 101.  You have abdominal pain or discomfort that is severe or gets worse throughout the day.   Your colonoscopy was relatively unremarkable.  I did not find any polyps or evidence of colon cancer.  I recommend repeating colonoscopy in 10 years for colon cancer screening purposes.  You do have diverticulosis and internal hemorrhoids. I would recommend increasing fiber in your diet or adding OTC Benefiber/Metamucil. Be sure to drink at least 4 to 6 glasses of water daily. Follow-up with GI as needed.   I hope you have a great rest of your week!  Elon Alas. Abbey Chatters, D.O. Gastroenterology and Hepatology [facility]  PATIENT INSTRUCTIONS POST-ANESTHESIA  IMMEDIATELY FOLLOWING SURGERY:  Do not drive or operate machinery for the first twenty four hours after surgery.  Do not make any important decisions for twenty four hours after surgery or while taking narcotic pain medications or sedatives.  If you develop intractable nausea and vomiting or a severe headache please notify your doctor immediately.  FOLLOW-UP:  Please make an appointment with your surgeon as instructed. You do not need to follow up with anesthesia unless specifically instructed to do so.  WOUND CARE INSTRUCTIONS (if  applicable):  Keep a dry clean dressing on the anesthesia/puncture wound site if there is drainage.  Once the wound has quit draining you may leave it open to air.  Generally you should leave the bandage intact for twenty four hours unless there is drainage.  If the epidural site drains for more than 36-48 hours please call the anesthesia department.  QUESTIONS?:  Please feel free to call your  physician or the hospital operator if you have any questions, and they will be happy to assist you.

## 2020-11-18 NOTE — Anesthesia Preprocedure Evaluation (Signed)
Anesthesia Evaluation  Patient identified by MRN, date of birth, ID band Patient awake    Reviewed: Allergy & Precautions, H&P , NPO status , Patient's Chart, lab work & pertinent test results, reviewed documented beta blocker date and time   Airway Mallampati: II  TM Distance: >3 FB Neck ROM: full    Dental no notable dental hx.    Pulmonary neg pulmonary ROS, former smoker,    Pulmonary exam normal breath sounds clear to auscultation       Cardiovascular Exercise Tolerance: Good negative cardio ROS   Rhythm:regular Rate:Normal     Neuro/Psych PSYCHIATRIC DISORDERS Anxiety Depression negative neurological ROS     GI/Hepatic negative GI ROS, Neg liver ROS,   Endo/Other  negative endocrine ROS  Renal/GU negative Renal ROS  negative genitourinary   Musculoskeletal   Abdominal   Peds  Hematology negative hematology ROS (+)   Anesthesia Other Findings   Reproductive/Obstetrics negative OB ROS                             Anesthesia Physical Anesthesia Plan  ASA: 2  Anesthesia Plan: General   Post-op Pain Management:    Induction:   PONV Risk Score and Plan: Propofol infusion  Airway Management Planned:   Additional Equipment:   Intra-op Plan:   Post-operative Plan:   Informed Consent: I have reviewed the patients History and Physical, chart, labs and discussed the procedure including the risks, benefits and alternatives for the proposed anesthesia with the patient or authorized representative who has indicated his/her understanding and acceptance.     Dental Advisory Given  Plan Discussed with: CRNA  Anesthesia Plan Comments:         Anesthesia Quick Evaluation

## 2020-11-18 NOTE — H&P (Signed)
Primary Care Physician:  Asencion Noble, MD Primary Gastroenterologist:  Dr. Abbey Chatters  Pre-Procedure History & Physical: HPI:  Hannah Contreras is a 70 y.o. female is here for a colonoscopy for colon cancer screening purposes.  Patient denies any family history of colorectal cancer.  No melena or hematochezia.  No abdominal pain or unintentional weight loss.  No change in bowel habits.  Overall feels well from a GI standpoint.  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  right hip., left arm and elbow, right arm.     Prior to Admission medications   Medication Sig Start Date End Date Taking? Authorizing Provider  ALPRAZolam Duanne Moron) 0.5 MG tablet Take 0.25-0.5 mg by mouth 3 (three) times daily as needed for anxiety. 08/06/19  Yes [provider]  Multiple Vitamins-Minerals (PRESERVISION AREDS 2 PO) Take 2 tablets by mouth daily.   Yes [provider]  OVER THE COUNTER MEDICATION Take 2 capsules by mouth daily. Herbals/ joint advantage gold Glucosamine and Tumeric   Yes [provider]  polyethylene glycol-electrolytes (TRILYTE) 420 g solution Take 4,000 mLs by mouth as directed. 10/03/20  Yes Eloise Harman, DO  vitamin C (ASCORBIC ACID) 500 MG tablet Take 500 mg by mouth daily.   Yes [provider]  VITAMIN D PO Take 1 tablet by mouth daily.   Yes [provider]    Allergies as of 10/03/2020 - Review Complete 10/03/2020  Allergen Reaction Noted   Codeine Nausea Only 07/31/2013   Penicillins Other (See Comments)     Family History  Problem Relation Age of Onset   Heart disease Mother    Heart attack Father    Rheum arthritis  Father    Other Son        acoustic neuroma    Social History   Socioeconomic History   Marital status: Married    Spouse name: Marcello Moores   Number of children: Not on file   Years of education: Not on file   Highest education level: Not on file  Occupational History   Not on file  Tobacco Use   Smoking status: Former    Types: Cigarettes   Smokeless tobacco: Never  Vaping Use   Vaping Use: Never used  Substance and Sexual Activity   Alcohol use: Yes    Comment: occ   Drug use: No   Sexual activity: Not Currently    Birth control/protection: Post-menopausal  Other Topics Concern   Not on file  Social History Narrative   Not on file   Social Determinants of Health   Financial Resource Strain: Low Risk    Difficulty of Paying Living Expenses: Not hard at all  Food Insecurity: No Food Insecurity   Worried About Charity fundraiser in the Last Year: Never true   [place] in the Last Year: Never true  Transportation Needs: No Transportation Needs   Lack of Transportation (Medical): No   Lack of Transportation (Non-Medical): No  Physical Activity: Sufficiently Active   Days of Exercise per Week: 5 days  Minutes of Exercise per Session: 50 min  Stress: Stress Concern Present   Feeling of Stress : To some extent  Social Connections: Engineer, building services of Communication with Friends and Family: More than three times a week   Frequency of Social Gatherings with Friends and Family: Once a week   Attends Religious Services: 1 to 4 times per year   Active Member of [employer] or Organizations: Yes   Attends Archivist Meetings: Never   Marital Status: Married  Human resources officer Violence: Not At Risk   Fear of Current or Ex-Partner: No   Emotionally Abused: No   Physically Abused: No   Sexually Abused: No    Review of Systems: See HPI, otherwise negative ROS  Physical Exam: Vital signs in last 24 hours: Temp:  [98.4 F (36.9 C)] 98.4 F (36.9  C) (11/18 0750) Pulse Rate:  [80] 80 (11/18 0750) Resp:  [14] 14 (11/18 0750) BP: (132)/(68) 132/68 (11/18 0750) SpO2:  [100 %] 100 % (11/18 0750) Weight:  [72.6 kg] 72.6 kg (11/18 0750)   General:   Alert,  Well-developed, well-nourished, pleasant and cooperative in NAD Head:  Normocephalic and atraumatic. Eyes:  Sclera clear, no icterus.   Conjunctiva pink. Ears:  Normal auditory acuity. Nose:  No deformity, discharge,  or lesions. Mouth:  No deformity or lesions, dentition normal. Neck:  Supple; no masses or thyromegaly. Lungs:  Clear throughout to auscultation.   No wheezes, crackles, or rhonchi. No acute distress. Heart:  Regular rate and rhythm; no murmurs, clicks, rubs,  or gallops. Abdomen:  Soft, nontender and nondistended. No masses, hepatosplenomegaly or hernias noted. Normal bowel sounds, without guarding, and without rebound.   Msk:  Symmetrical without gross deformities. Normal posture. Extremities:  Without clubbing or edema. Neurologic:  Alert and  oriented x4;  grossly normal neurologically. Skin:  Intact without significant lesions or rashes. Cervical Nodes:  No significant cervical adenopathy. Psych:  Alert and cooperative. Normal mood and affect.  Impression/Plan: Hannah Contreras is here for a colonoscopy to be performed for colon cancer screening purposes.  The risks of the procedure including infection, bleed, or perforation as well as benefits, limitations, alternatives and imponderables have been reviewed with the patient. Questions have been answered. All parties agreeable.

## 2020-11-18 NOTE — Op Note (Signed)
[facility] Patient Name: Hannah Contreras Procedure Date: 11/18/2020 8:27 AM MRN: [id number] Date of Birth: Apr 15, 1950 Attending MD: Elon Alas. Abbey Chatters DO CSN: [id number] Age: 70 Admit Type: Outpatient Procedure:                Colonoscopy Indications:              Screening for colorectal malignant neoplasm Providers:                Elon Alas. Abbey Chatters, DO, Lurline Del, RN, Kristine L.                            Risa Grill, Technician Referring MD:              Medicines:                See the Anesthesia note for documentation of the                            administered medications Complications:            No immediate complications. Estimated Blood Loss:     Estimated blood loss: none. Procedure:                Pre-Anesthesia Assessment:                           - The anesthesia plan was to use monitored                            anesthesia care (MAC).                           After obtaining informed consent, the colonoscope                            was passed under direct vision. Throughout the                            procedure, the patient's blood pressure, pulse, and                            oxygen saturations were monitored continuously. The                            [id number]-HQ190L ([id number]) scope was introduced through                            the anus and advanced to the the cecum, identified                            by appendiceal orifice and ileocecal valve. The                            colonoscopy was performed without difficulty. The                            patient tolerated the procedure  well. The quality                            of the bowel preparation was evaluated using the                            BBPS [facility] Bowel Preparation Scale) with scores                            of: Right Colon = 3, Transverse Colon = 3 and Left                            Colon = 3 (entire mucosa seen well with no residual                            staining,  small fragments of stool or opaque                            liquid). The total BBPS score equals 9. Scope In: 8:38:12 AM Scope Out: 8:49:51 AM Scope Withdrawal Time: 0 hours 6 minutes 57 seconds  Total Procedure Duration: 0 hours 11 minutes 39 seconds  Findings:      The perianal and digital rectal examinations were normal.      Non-bleeding internal hemorrhoids were found during endoscopy.      Multiple medium-mouthed diverticula were found in the sigmoid colon.      The exam was otherwise without abnormality. Impression:               - Non-bleeding internal hemorrhoids.                           - Diverticulosis in the sigmoid colon.                           - The examination was otherwise normal.                           - No specimens collected. Moderate Sedation:      Per Anesthesia Care Recommendation:           - Patient has a contact number available for                            emergencies. The signs and symptoms of potential                            delayed complications were discussed with the                            patient. Return to normal activities tomorrow.                            Written discharge instructions were provided to the                            patient.                           -  Resume previous diet.                           - Continue present medications.                           - Repeat colonoscopy in 10 years for screening                            purposes.                           - Return to GI clinic PRN. Procedure Code(s):        --- Professional ---                           A3094, Colorectal cancer screening; colonoscopy on                            individual not meeting criteria for high risk Diagnosis Code(s):        --- Professional ---                           Z12.11, Encounter for screening for malignant                            neoplasm of colon                           K64.8, Other hemorrhoids                            K57.30, Diverticulosis of large intestine without                            perforation or abscess without bleeding CPT copyright 2019 American Medical Association. All rights reserved. The codes documented in this report are preliminary and upon coder review may  be revised to meet current compliance requirements. Elon Alas. Abbey Chatters, DO [place] Abbey Chatters, DO 11/18/2020 8:52:30 AM This report has been signed electronically. Number of Addenda: 0

## 2020-11-18 NOTE — Transfer of Care (Signed)
Immediate Anesthesia Transfer of Care Note  Patient: Hannah Contreras  Procedure(s) Performed: COLONOSCOPY WITH PROPOFOL  Patient Location: Endoscopy Unit  Anesthesia Type:General  Level of Consciousness: awake  Airway & Oxygen Therapy: Patient Spontanous Breathing  Post-op Assessment: Report given to RN  Post vital signs: Reviewed and stable  Last Vitals:  Vitals Value Taken Time  BP    Temp    Pulse    Resp    SpO2      Last Pain:  Vitals:   11/18/20 0750  TempSrc: Oral  PainSc: 1       Patients Stated Pain Goal: 8 (54/62/70 3500)  Complications: No notable events documented.

## 2020-11-18 NOTE — Anesthesia Postprocedure Evaluation (Signed)
Anesthesia Post Note  Patient: Hannah Contreras  Procedure(s) Performed: COLONOSCOPY WITH PROPOFOL  Patient location during evaluation: Endoscopy Anesthesia Type: General Level of consciousness: awake and alert Pain management: pain level controlled Vital Signs Assessment: post-procedure vital signs reviewed and stable Respiratory status: spontaneous breathing Cardiovascular status: blood pressure returned to baseline and stable Postop Assessment: no apparent nausea or vomiting Anesthetic complications: no   No notable events documented.   Last Vitals:  Vitals:   11/18/20 0750  BP: 132/68  Pulse: 80  Resp: 14  Temp: 36.9 C  SpO2: 100%    Last Pain:  Vitals:   11/18/20 0750  TempSrc: Oral  PainSc: 1                  Kiam Bransfield

## 2020-11-21 ENCOUNTER — Other Ambulatory Visit: Payer: Self-pay

## 2020-11-21 ENCOUNTER — Ambulatory Visit (HOSPITAL_COMMUNITY): Payer: Medicare PPO | Admitting: Physical Therapy

## 2020-11-21 ENCOUNTER — Encounter (HOSPITAL_COMMUNITY): Payer: Self-pay | Admitting: Physical Therapy

## 2020-11-21 DIAGNOSIS — H353221 Exudative age-related macular degeneration, left eye, with active choroidal neovascularization: Secondary | ICD-10-CM | POA: Diagnosis not present

## 2020-11-21 DIAGNOSIS — M25551 Pain in right hip: Secondary | ICD-10-CM

## 2020-11-21 DIAGNOSIS — M1611 Unilateral primary osteoarthritis, right hip: Secondary | ICD-10-CM | POA: Diagnosis not present

## 2020-11-21 DIAGNOSIS — M7061 Trochanteric bursitis, right hip: Secondary | ICD-10-CM | POA: Diagnosis not present

## 2020-11-21 NOTE — Therapy (Signed)
[place], [place], [address] Phone: [phone]   Fax:  [phone]  Physical Therapy Treatment  Patient Details  Name: Hannah Contreras MRN: [id number] Date of Birth: 08-27-1950 Referring Provider (PT): Arther Abbott MD   Encounter Date: 11/21/2020   PT End of Session - 11/21/20 0742     Visit Number 6    Number of Visits 8    Date for PT Re-Evaluation 12/01/20    Authorization Type [employer] medicare    Authorization Time Period [place] approved  PT 8 visits  11/3/-12/01/2020    Authorization - Visit Number 5    Authorization - Number of Visits 8    PT Start Time 0745    PT Stop Time 0825    PT Time Calculation (min) 40 min    Activity Tolerance Patient tolerated treatment well    Behavior During Therapy [facility] for tasks assessed/performed             Past Medical History:  Diagnosis Date   Anxiety    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  right hip., left arm and elbow, right arm.     There were no vitals filed for this visit.   Subjective Assessment - 11/21/20 0743     Subjective Patient states her hip is doing good except for the other day when she walked. It was hurting in the front hip muscles.    Currently in Pain? No/denies                               [facility] Adult PT Treatment/Exercise - 11/21/20 0001       Knee/Hip Exercises: Stretches   Sports administrator 20 seconds    Quad Stretch Limitations prone with strap, no change -discontinued    Other Knee/Hip Stretches Thomas stretch 2x 60 second holds      Knee/Hip Exercises: Standing   Hip Flexion Both;2 sets;10 reps    Lateral Step Up Right;2  sets;10 reps;Hand Hold: 0;Step Height: 6"    Forward Step Up Right;2 sets;10 reps;Step Height: 6"    Functional Squat 2 sets;10 reps    Functional Squat Limitations with UE support    SLS 3x 30 second holds bilateral    SLS with Vectors 5X5" each with 1 HHA      Knee/Hip Exercises: Supine   Bridges with Clamshell Both;2 sets;10 reps   green band   Other Supine Knee/Hip Exercises hip flex stretch at step 3x 20 second holds RLE                     PT Education - 11/21/20 0743     Education Details HEP    Person(s) Educated Patient    Methods Explanation;Demonstration    Comprehension Verbalized understanding;Returned demonstration              PT Short Term Goals - 11/03/20 1327       PT SHORT TERM GOAL #1   Title Patient will be independent with initial HEP and self-management strategies to  improve functional outcomes    Time 2    Period Weeks    Status New    Target Date 11/17/20               PT Long Term Goals - 11/03/20 1327       PT LONG TERM GOAL #1   Title Patient will be independent with advance HEP and self-management strategies to improve functional outcomes    Time 4    Period Weeks    Status New    Target Date 12/01/20      PT LONG TERM GOAL #2   Title Patient will improve FOTO score to predicted value to indicate improvement in functional outcomes    Time 4    Period Weeks    Status New    Target Date 12/01/20      PT LONG TERM GOAL #3   Title Patient will report at least 65% overall improvement in subjective complaint to indicate improvement in ability to perform ADLs.    Time 4    Period Weeks    Status New    Target Date 12/01/20      PT LONG TERM GOAL #4   Title Patient will be able to walk for exercise up to 45 minutes with hip pain not to exceed 3/10 for improved functional outcomes and QOL    Baseline current 30 minutes with 7/10 pain    Time 4    Period Weeks    Status New    Target Date 12/01/20                    Plan - 11/21/20 0742     Clinical Impression Statement Patient with tenderness in rectus femoris. Patient not able to target tender area with prone quad stretch. Utilized Thomas stretch which targeted tender area with improvement in symptoms following. Continued with strengthening exercises which are tolerated well. Requires cueing for proper squat mechanics but requires UE support to complete properly. Patient will continue to benefit from skilled physical therapy in order to reduce impairment and improve function.    Examination-Activity Limitations Stand;Locomotion Level;Stairs;Squat    Examination-Participation Restrictions Cleaning;Yard Work;Laundry;Shop;Community Activity    Stability/Clinical Decision Making Stable/Uncomplicated    Rehab Potential Good    PT Frequency 2x / week    PT Duration 4 weeks    PT Treatment/Interventions ADLs/Self Care Home Management;Biofeedback;Cryotherapy;Ultrasound;Traction;Functional mobility training;Neuromuscular re-education;Stair training;Gait training;Iontophoresis 4mg /ml Dexamethasone;DME Instruction;Contrast Bath;Electrical Stimulation;Therapeutic exercise;Orthotic Fit/Training;Taping;Compression bandaging;Balance training;Scar mobilization;Passive range of motion;Manual techniques;Therapeutic activities;Patient/family education;Parrafin;Fluidtherapy;Manual lymph drainage;Energy conservation;Vasopneumatic Device;Visual/perceptual remediation/compensation;Spinal Manipulations;Joint Manipulations;Splinting;Dry needling;Moist Heat    PT Next Visit Plan manual as needed, i.e. Hip traction, lateral joint mobs for improved hip flexion, progress core and glute strength as tolerated.  self mobs with mobility band, squats    PT Home Exercise Plan Eval: iso hip flexion, bridge 10/9 hip abd/add iso 11/10 hip abd, hip extension, STS, lateral stepping    Consulted and Agree with Plan of Care Patient             Patient will benefit from skilled  therapeutic intervention in order to improve the following deficits and impairments:  Pain, Improper body mechanics, Increased fascial restricitons, Impaired flexibility, Decreased range of motion, Decreased activity tolerance, Decreased strength, Hypomobility, Decreased mobility  Visit Diagnosis: Pain in right hip     Problem List Patient Active Problem List   Diagnosis Date Noted   Encounter for screening for malignant neoplasm of colon 10/03/2020   Muscle weakness (  generalized) 08/27/2013   Decreased range of motion (ROM) of shoulder 08/27/2013   Fracture of proximal end of left humerus 08/04/2013   SHOULDER PAIN 02/16/2009   IMPINGEMENT SYNDROME 02/16/2009    8:22 AM, 11/21/20 Mearl Latin PT, DPT Physical Therapist at [place], [place], [address] Phone: [phone]   Fax:  [phone]  Name: Hannah Contreras MRN: [id number] Date of Birth: 08-07-1950

## 2020-11-22 ENCOUNTER — Encounter (HOSPITAL_COMMUNITY): Payer: Self-pay | Admitting: Internal Medicine

## 2020-11-28 ENCOUNTER — Ambulatory Visit (HOSPITAL_COMMUNITY): Payer: Medicare PPO | Admitting: Physical Therapy

## 2020-11-28 ENCOUNTER — Other Ambulatory Visit: Payer: Self-pay

## 2020-11-28 ENCOUNTER — Encounter (HOSPITAL_COMMUNITY): Payer: Self-pay | Admitting: Physical Therapy

## 2020-11-28 DIAGNOSIS — M7061 Trochanteric bursitis, right hip: Secondary | ICD-10-CM | POA: Diagnosis not present

## 2020-11-28 DIAGNOSIS — M1611 Unilateral primary osteoarthritis, right hip: Secondary | ICD-10-CM | POA: Diagnosis not present

## 2020-11-28 DIAGNOSIS — M25551 Pain in right hip: Secondary | ICD-10-CM

## 2020-11-28 NOTE — Patient Instructions (Signed)
Access Code: [email or web address] URL: [email or web address] Date: 11/28/2020 Prepared by: Josue Hector  Exercises Supine Bridge - 1 x daily - 4-5 x weekly - 2 sets - 10 reps - 3-5 sec hold Hooklying Isometric Hip Flexion - 1 x daily - 4-5 x weekly - 2 sets - 10 reps - 3-5 sec hold Mini Squat with Chair - 1 x daily - 4-5 x weekly - 2 sets - 10 reps Hooklying Isometric Clamshell - 1 x daily - 4-5 x weekly - 2 sets - 10 reps - 3-5 sec hold Supine Hip Adduction Isometric with Ball - 1 x daily - 4-5 x weekly - 2 sets - 10 reps - 3-5 sec hold

## 2020-11-28 NOTE — Therapy (Signed)
[facility] [address] [place], [place], [address] Phone: [phone]   Fax:  [phone]  Physical Therapy Treatment  Patient Details  Name: Hannah Contreras MRN: [id number] Date of Birth: 1950/01/13 Referring Provider (PT): Arther Abbott MD   PHYSICAL THERAPY DISCHARGE SUMMARY  Visits from Start of Care: 7  Current functional level related to goals / functional outcomes: See below    Remaining deficits: See below    Education / Equipment: See assessment    Patient agrees to discharge. Patient goals were not met. Patient is being discharged due to lack of progress.  Encounter Date: 11/28/2020   PT End of Session - 11/28/20 0829     Visit Number 7    Number of Visits 8    Date for PT Re-Evaluation 12/01/20    Authorization Type [employer] medicare    Authorization Time Period [place] approved  PT 8 visits  11/3/-12/01/2020    Authorization - Visit Number 6    Authorization - Number of Visits 8    PT Start Time [phone]    PT Stop Time 0900    PT Time Calculation (min) 43 min    Activity Tolerance Patient tolerated treatment well    Behavior During Therapy [facility] for tasks assessed/performed             Past Medical History:  Diagnosis Date   Anxiety    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  right hip., left arm and elbow, right arm.     There were no vitals filed for this visit.   Subjective Assessment - 11/28/20 0820     Subjective "not where I want to be".  Feels she has not made as much progress as she would have liked. She says she has been trying to walk and do exercises but pain has gotten increasingly worse.    Limitations Lifting;Standing;Walking    How long can you walk comfortably? 30 minutes    Patient Stated Goals Knowing what stretches and exercises to do    Currently in Pain? Yes    Pain Score 1    1/10 at rest, 8-9/10 with activity   Pain Location Hip    Pain Orientation Right    Pain Descriptors / Indicators Aching    Pain Type Acute pain    Pain Onset More than a month ago    Pain Frequency Intermittent    Aggravating Factors  Increased acitivity, External rotation, extension of hip    Pain Relieving Factors stopping activity    Effect of Pain on Daily Activities Limits                [facility] PT Assessment - 11/28/20 0001       Assessment   Medical Diagnosis RT hip pain/ arthritis    Referring Provider (PT) Arther Abbott MD    Prior Therapy Yes      Precautions   Precautions None  Restrictions   Weight Bearing Restrictions No      Balance Screen   Has the patient fallen in the past 6 months No      [place] residence      Prior Function   Level of Independence Independent      Cognition   Overall Cognitive Status Within Functional Limits for tasks assessed      Observation/Other Assessments   Focus on Therapeutic Outcomes (FOTO)  62% function   was 67% function     AROM   Right Hip Extension 8   was 5   Right Hip Flexion 90   same (pain)     Strength   Right Hip Flexion 4+/5   same (discomfort)   Right Hip Extension 4+/5   same   Right Hip ABduction 4+/5   was 4   Left Hip Flexion 5/5    Left Hip Extension 5/5   was 4+   Left Hip ABduction 5/5   was 4+     Palpation   Palpation comment min/mod TTP about RT greater trochanter   same                                   PT Education - 11/28/20 0900     Education Details on  HEP, reassessment findings and self mobilizations with band    Person(s) Educated Patient    Methods Explanation;Handout    Comprehension Verbalized understanding              PT Short Term Goals - 11/28/20 0856       PT SHORT TERM GOAL #1   Title Patient will be independent with initial HEP and self-management strategies to improve functional outcomes    Time 2    Period Weeks    Status Achieved    Target Date 11/17/20               PT Long Term Goals - 11/28/20 0857       PT LONG TERM GOAL #1   Title Patient will be independent with advance HEP and self-management strategies to improve functional outcomes    Baseline Reviewed and answered all questions    Time 4    Period Weeks    Status Achieved      PT LONG TERM GOAL #2   Title Patient will improve FOTO score to predicted value to indicate improvement in functional outcomes    Baseline Decreased 5 points    Time 4    Period Weeks    Status Not Met      PT LONG TERM GOAL #3   Title Patient will report at least 65% overall improvement in subjective complaint to indicate improvement in ability to perform ADLs.    Baseline Reports 50%    Time 4    Period Weeks    Status Not Met      PT LONG TERM GOAL #4   Title Patient will be able to walk for exercise up to 45 minutes with hip pain not to exceed 3/10 for improved functional outcomes and QOL    Baseline Still around 30 minutes with 8/10 pain    Time 4    Period Weeks    Status Not Met                   Plan - 11/28/20 0721  Clinical Impression Statement Patient shows improved strength but remains pain limited with activity. Subjective reporting shows overall decrease in function despite compliance with HEP. At this time patient will be DC from therapy to return to referring provider for further assessment. Reviewed comprehensive HEP and issued handout. Answered all patient questions. Patient encouraged to follow up with therapy services with  any further questions or concerns.    Examination-Activity Limitations Stand;Locomotion Level;Stairs;Squat    Examination-Participation Restrictions Cleaning;Yard Work;Laundry;Shop;Community Activity    Stability/Clinical Decision Making Stable/Uncomplicated    Rehab Potential Good    PT Treatment/Interventions ADLs/Self Care Home Management;Biofeedback;Cryotherapy;Ultrasound;Traction;Functional mobility training;Neuromuscular re-education;Stair training;Gait training;Iontophoresis 85m/ml Dexamethasone;DME Instruction;Contrast Bath;Electrical Stimulation;Therapeutic exercise;Orthotic Fit/Training;Taping;Compression bandaging;Balance training;Scar mobilization;Passive range of motion;Manual techniques;Therapeutic activities;Patient/family education;Parrafin;Fluidtherapy;Manual lymph drainage;Energy conservation;Vasopneumatic Device;Visual/perceptual remediation/compensation;Spinal Manipulations;Joint Manipulations;Splinting;Dry needling;Moist Heat    PT Next Visit Plan DC to HEP    PT Home Exercise Plan Eval: iso hip flexion, bridge 10/9 hip abd/add iso 11/10 hip abd, hip extension, STS, lateral stepping    Consulted and Agree with Plan of Care Patient             Patient will benefit from skilled therapeutic intervention in order to improve the following deficits and impairments:  Pain, Improper body mechanics, Increased fascial restricitons, Impaired flexibility, Decreased range of motion, Decreased activity tolerance, Decreased strength, Hypomobility, Decreased mobility  Visit Diagnosis: Pain in right hip     Problem List Patient Active Problem List   Diagnosis Date Noted   Encounter for screening for malignant neoplasm of colon 10/03/2020   Muscle weakness (generalized) 08/27/2013   Decreased range of motion (ROM) of shoulder 08/27/2013   Fracture of proximal end of left humerus 08/04/2013   SHOULDER PAIN 02/16/2009   IMPINGEMENT SYNDROME 02/16/2009   9:02 AM, 11/28/20 CJosue HectorPT DPT  [facility] [phone] [address]Phone: [phone]  Fax:  [phone] Name: Hannah FreetMRN: [id number] of Birth: 123-May-1952

## 2020-11-30 ENCOUNTER — Ambulatory Visit (HOSPITAL_COMMUNITY): Payer: Medicare PPO | Admitting: Physical Therapy

## 2020-12-06 ENCOUNTER — Encounter (HOSPITAL_COMMUNITY): Payer: Medicare PPO | Admitting: Physical Therapy

## 2020-12-08 ENCOUNTER — Encounter (HOSPITAL_COMMUNITY): Payer: Medicare PPO | Admitting: Physical Therapy

## 2020-12-13 ENCOUNTER — Encounter (HOSPITAL_COMMUNITY): Payer: Medicare PPO | Admitting: Physical Therapy

## 2020-12-15 ENCOUNTER — Encounter (HOSPITAL_COMMUNITY): Payer: Medicare PPO | Admitting: Physical Therapy

## 2020-12-30 ENCOUNTER — Other Ambulatory Visit (HOSPITAL_COMMUNITY): Payer: Self-pay | Admitting: Internal Medicine

## 2020-12-30 DIAGNOSIS — Z1231 Encounter for screening mammogram for malignant neoplasm of breast: Secondary | ICD-10-CM

## 2021-01-10 DIAGNOSIS — H353221 Exudative age-related macular degeneration, left eye, with active choroidal neovascularization: Secondary | ICD-10-CM | POA: Diagnosis not present

## 2021-01-16 ENCOUNTER — Other Ambulatory Visit: Payer: Self-pay

## 2021-01-16 ENCOUNTER — Ambulatory Visit (HOSPITAL_COMMUNITY)
Admission: RE | Admit: 2021-01-16 | Discharge: 2021-01-16 | Disposition: A | Discharge status: Home / Self Care | Payer: Medicare PPO | Source: Ambulatory Visit | Attending: Internal Medicine | Admitting: Internal Medicine

## 2021-01-16 DIAGNOSIS — Z1231 Encounter for screening mammogram for malignant neoplasm of breast: Secondary | ICD-10-CM | POA: Diagnosis not present

## 2021-01-23 ENCOUNTER — Other Ambulatory Visit: Payer: Self-pay

## 2021-01-23 ENCOUNTER — Ambulatory Visit: Payer: Medicare PPO

## 2021-01-23 ENCOUNTER — Encounter: Payer: Self-pay | Admitting: Orthopedic Surgery

## 2021-01-23 ENCOUNTER — Ambulatory Visit: Payer: Medicare PPO | Admitting: Orthopedic Surgery

## 2021-01-23 DIAGNOSIS — M7061 Trochanteric bursitis, right hip: Secondary | ICD-10-CM | POA: Diagnosis not present

## 2021-01-23 DIAGNOSIS — M1611 Unilateral primary osteoarthritis, right hip: Secondary | ICD-10-CM

## 2021-01-23 NOTE — Progress Notes (Signed)
FOLLOW UP   Encounter Diagnoses  Name Primary?   Arthritis of right hip Yes   Greater trochanteric bursitis of right hip      Chief Complaint  Patient presents with   Hip Pain    Right states no improvement with physical therapy went for a month      Hannah Contreras had ORIF for right hip for femoral neck fracture with cannulated screws  She developed painful right hip and groin had an MRI and x-ray showed osteoarthritis with some mild cartilage loss.  She had physical therapy has tried some NSAIDs but does not like to take medicine  Complains of inability to walk for an hour like she did before.  X-ray was taken osteoarthritis mild in the weightbearing area little more narrowing inferiorly the screws are intact with no complication  We discussed this at length  Recommend Tylenol or Advil for pain patient should continue her exercises her weight is excellent  Follow-up in April if needed

## 2021-04-12 DIAGNOSIS — F331 Major depressive disorder, recurrent, moderate: Secondary | ICD-10-CM | POA: Diagnosis not present

## 2021-04-14 DIAGNOSIS — R001 Bradycardia, unspecified: Secondary | ICD-10-CM | POA: Diagnosis not present

## 2021-04-14 DIAGNOSIS — Z79899 Other long term (current) drug therapy: Secondary | ICD-10-CM | POA: Diagnosis not present

## 2021-04-14 DIAGNOSIS — F419 Anxiety disorder, unspecified: Secondary | ICD-10-CM | POA: Diagnosis not present

## 2021-04-14 DIAGNOSIS — H3530 Unspecified macular degeneration: Secondary | ICD-10-CM | POA: Diagnosis not present

## 2021-04-14 DIAGNOSIS — R7301 Impaired fasting glucose: Secondary | ICD-10-CM | POA: Diagnosis not present

## 2021-04-14 DIAGNOSIS — E785 Hyperlipidemia, unspecified: Secondary | ICD-10-CM | POA: Diagnosis not present

## 2021-04-14 DIAGNOSIS — F32A Depression, unspecified: Secondary | ICD-10-CM | POA: Diagnosis not present

## 2021-04-20 ENCOUNTER — Ambulatory Visit: Payer: Medicare PPO | Admitting: Orthopedic Surgery

## 2021-04-21 DIAGNOSIS — Z0000 Encounter for general adult medical examination without abnormal findings: Secondary | ICD-10-CM | POA: Diagnosis not present

## 2021-04-21 DIAGNOSIS — E785 Hyperlipidemia, unspecified: Secondary | ICD-10-CM | POA: Diagnosis not present

## 2021-04-21 DIAGNOSIS — Z6828 Body mass index (BMI) 28.0-28.9, adult: Secondary | ICD-10-CM | POA: Diagnosis not present

## 2021-04-21 DIAGNOSIS — F331 Major depressive disorder, recurrent, moderate: Secondary | ICD-10-CM | POA: Diagnosis not present

## 2021-04-21 DIAGNOSIS — M150 Primary generalized (osteo)arthritis: Secondary | ICD-10-CM | POA: Diagnosis not present

## 2021-04-21 DIAGNOSIS — Z0001 Encounter for general adult medical examination with abnormal findings: Secondary | ICD-10-CM | POA: Diagnosis not present

## 2021-06-15 ENCOUNTER — Ambulatory Visit: Payer: Medicare PPO | Admitting: Physician Assistant

## 2021-08-23 ENCOUNTER — Ambulatory Visit: Payer: Medicare PPO | Admitting: Physician Assistant

## 2021-08-24 DIAGNOSIS — H353221 Exudative age-related macular degeneration, left eye, with active choroidal neovascularization: Secondary | ICD-10-CM | POA: Diagnosis not present

## 2021-09-18 DIAGNOSIS — H353221 Exudative age-related macular degeneration, left eye, with active choroidal neovascularization: Secondary | ICD-10-CM | POA: Diagnosis not present

## 2021-09-25 ENCOUNTER — Ambulatory Visit: Payer: Medicare PPO | Admitting: Orthopedic Surgery

## 2021-10-03 DIAGNOSIS — H2513 Age-related nuclear cataract, bilateral: Secondary | ICD-10-CM | POA: Diagnosis not present

## 2021-10-03 DIAGNOSIS — H353221 Exudative age-related macular degeneration, left eye, with active choroidal neovascularization: Secondary | ICD-10-CM | POA: Diagnosis not present

## 2021-10-11 DIAGNOSIS — E785 Hyperlipidemia, unspecified: Secondary | ICD-10-CM | POA: Diagnosis not present

## 2021-10-11 DIAGNOSIS — F32A Depression, unspecified: Secondary | ICD-10-CM | POA: Diagnosis not present

## 2021-10-11 DIAGNOSIS — Z79899 Other long term (current) drug therapy: Secondary | ICD-10-CM | POA: Diagnosis not present

## 2021-10-11 DIAGNOSIS — M1990 Unspecified osteoarthritis, unspecified site: Secondary | ICD-10-CM | POA: Diagnosis not present

## 2021-10-23 ENCOUNTER — Ambulatory Visit: Payer: Medicare PPO | Admitting: Orthopedic Surgery

## 2021-11-02 DIAGNOSIS — Z23 Encounter for immunization: Secondary | ICD-10-CM | POA: Diagnosis not present

## 2021-11-08 DIAGNOSIS — H353221 Exudative age-related macular degeneration, left eye, with active choroidal neovascularization: Secondary | ICD-10-CM | POA: Diagnosis not present

## 2021-12-14 DIAGNOSIS — H353221 Exudative age-related macular degeneration, left eye, with active choroidal neovascularization: Secondary | ICD-10-CM | POA: Diagnosis not present

## 2022-01-25 DIAGNOSIS — H353221 Exudative age-related macular degeneration, left eye, with active choroidal neovascularization: Secondary | ICD-10-CM | POA: Diagnosis not present

## 2022-02-15 DIAGNOSIS — H16142 Punctate keratitis, left eye: Secondary | ICD-10-CM | POA: Diagnosis not present

## 2022-02-26 DIAGNOSIS — M7061 Trochanteric bursitis, right hip: Secondary | ICD-10-CM | POA: Diagnosis not present

## 2022-02-26 DIAGNOSIS — M25551 Pain in right hip: Secondary | ICD-10-CM | POA: Diagnosis not present

## 2022-02-26 DIAGNOSIS — M1611 Unilateral primary osteoarthritis, right hip: Secondary | ICD-10-CM | POA: Diagnosis not present

## 2022-03-01 ENCOUNTER — Encounter: Payer: Self-pay | Admitting: Radiology

## 2022-03-06 DIAGNOSIS — J019 Acute sinusitis, unspecified: Secondary | ICD-10-CM | POA: Diagnosis not present

## 2022-03-06 DIAGNOSIS — F329 Major depressive disorder, single episode, unspecified: Secondary | ICD-10-CM | POA: Diagnosis not present

## 2022-03-20 DIAGNOSIS — L568 Other specified acute skin changes due to ultraviolet radiation: Secondary | ICD-10-CM | POA: Diagnosis not present

## 2022-03-20 DIAGNOSIS — Z1283 Encounter for screening for malignant neoplasm of skin: Secondary | ICD-10-CM | POA: Diagnosis not present

## 2022-03-20 DIAGNOSIS — D485 Neoplasm of uncertain behavior of skin: Secondary | ICD-10-CM | POA: Diagnosis not present

## 2022-03-20 DIAGNOSIS — L820 Inflamed seborrheic keratosis: Secondary | ICD-10-CM | POA: Diagnosis not present

## 2022-03-21 ENCOUNTER — Other Ambulatory Visit (HOSPITAL_COMMUNITY): Payer: Self-pay | Admitting: Internal Medicine

## 2022-03-21 DIAGNOSIS — Z1231 Encounter for screening mammogram for malignant neoplasm of breast: Secondary | ICD-10-CM

## 2022-03-29 ENCOUNTER — Ambulatory Visit (HOSPITAL_COMMUNITY)
Admission: RE | Admit: 2022-03-29 | Discharge: 2022-03-29 | Disposition: A | Discharge status: Home / Self Care | Payer: Medicare PPO | Source: Ambulatory Visit | Attending: Internal Medicine | Admitting: Internal Medicine

## 2022-03-29 ENCOUNTER — Encounter (HOSPITAL_COMMUNITY): Payer: Self-pay

## 2022-03-29 DIAGNOSIS — Z1231 Encounter for screening mammogram for malignant neoplasm of breast: Secondary | ICD-10-CM

## 2022-04-09 DIAGNOSIS — F325 Major depressive disorder, single episode, in full remission: Secondary | ICD-10-CM | POA: Diagnosis not present

## 2022-04-09 DIAGNOSIS — M1611 Unilateral primary osteoarthritis, right hip: Secondary | ICD-10-CM | POA: Diagnosis not present

## 2022-04-12 DIAGNOSIS — H353112 Nonexudative age-related macular degeneration, right eye, intermediate dry stage: Secondary | ICD-10-CM | POA: Diagnosis not present

## 2022-06-20 DIAGNOSIS — M1611 Unilateral primary osteoarthritis, right hip: Secondary | ICD-10-CM | POA: Diagnosis not present

## 2022-07-02 DIAGNOSIS — M25551 Pain in right hip: Secondary | ICD-10-CM | POA: Diagnosis not present

## 2022-07-26 DIAGNOSIS — M25551 Pain in right hip: Secondary | ICD-10-CM | POA: Diagnosis not present

## 2022-09-10 ENCOUNTER — Ambulatory Visit: Payer: Medicare PPO | Admitting: Orthopedic Surgery

## 2022-09-10 ENCOUNTER — Telehealth: Payer: Self-pay | Admitting: Radiology

## 2022-09-10 ENCOUNTER — Other Ambulatory Visit (INDEPENDENT_AMBULATORY_CARE_PROVIDER_SITE_OTHER): Payer: Medicare PPO

## 2022-09-10 VITALS — BP 114/79 | HR 62 | Ht 63.0 in | Wt 161.0 lb

## 2022-09-10 DIAGNOSIS — M4316 Spondylolisthesis, lumbar region: Secondary | ICD-10-CM

## 2022-09-10 DIAGNOSIS — M1611 Unilateral primary osteoarthritis, right hip: Secondary | ICD-10-CM | POA: Diagnosis not present

## 2022-09-10 DIAGNOSIS — M5410 Radiculopathy, site unspecified: Secondary | ICD-10-CM

## 2022-09-10 MED ORDER — GABAPENTIN 100 MG PO CAPS
100.0000 mg | ORAL_CAPSULE | Freq: Every day | ORAL | 2 refills | Status: DC
Start: 2022-09-10 — End: 2023-07-24

## 2022-09-10 NOTE — Progress Notes (Signed)
Chief Complaint  Patient presents with   Hip Pain    Right- pain wants to discuss surgery had 2 injections the first one worked great the second one was in July and did not help     Assessment and plan  Encounter Diagnoses  Name Primary?   Radicular pain of right lower extremity Yes   Arthritis of right hip    Spondylolisthesis, lumbar region      72 year old female with progressive arthritis right hip status post cannulated screw fixation right hip  She had an injection in her hip in late June surgery on the right hip could be done in October  Patient will be referred to Dr. Magnus Ivan for right total hip arthroplasty through anterior approach   I will start her on some gabapentin for her radicular pain   72 year old female longstanding pain left hip status post 2 injections in the joint  Comes in with increasing lateral right hip pain sometimes radiating to the knee and into the foot  Also noticing that she is not able to walk as well  She was evaluated at [facility] x-rays show progressive arthritis of the right hip with internal fixation screws from prior femoral neck pinning which was successful many many years ago     :  Physical Exam Vitals and nursing note reviewed.  Constitutional:      Appearance: Normal appearance.  HENT:     Head: Normocephalic and atraumatic.  Eyes:     General: No scleral icterus.       Right eye: No discharge.        Left eye: No discharge.     Extraocular Movements: Extraocular movements intact.     Conjunctiva/sclera: Conjunctivae normal.     Pupils: Pupils are equal, round, and reactive to light.  Cardiovascular:     Rate and Rhythm: Normal rate.     Pulses: Normal pulses.  Musculoskeletal:     Right hip: Bony tenderness and crepitus present. No deformity. Decreased range of motion.     Left hip: Normal.     Comments: Lumbar:  Non tender lumbar   But gluteal tenderness on the right   Skin:    General: Skin is warm and  dry.     Capillary Refill: Capillary refill takes less than 2 seconds.  Neurological:     General: No focal deficit present.     Mental Status: She is alert and oriented to person, place, and time.     Gait: Gait abnormal.  Psychiatric:        Mood and Affect: Mood normal.        Behavior: Behavior normal.        Thought Content: Thought content normal.        Judgment: Judgment normal.    Imaging of the spine -Spinal imaging  Lateral hip pain radiating lateral thigh into right foot  X-ray shows slight curvature in the lumbar spine on the AP view not enough to reach the definition scoliosis, her disc spaces look to be open she does have some abnormal lordosis in the lumbar spine, there is anterolisthesis 5 on S1   Facet joint arthritis L3-S1 and the L4 and 5 anterolisthesis is approximately 10 mm on the spot film  Impression spondylosis, anterolisthesis grade 1   Outside imaging of the hip shows 3 screws in the hip healed femoral neck fracture degenerative arthritis of the joint pictures

## 2022-09-10 NOTE — Telephone Encounter (Signed)
-----   Message from Fuller Canada sent at 09/10/2022 11:26 AM EDT ----- Please make a referral to Dr. Magnus Ivan for right total hip arthroplasty

## 2022-09-26 ENCOUNTER — Ambulatory Visit (INDEPENDENT_AMBULATORY_CARE_PROVIDER_SITE_OTHER): Payer: Medicare PPO | Admitting: Orthopaedic Surgery

## 2022-09-26 ENCOUNTER — Other Ambulatory Visit: Payer: Self-pay | Admitting: Orthopaedic Surgery

## 2022-09-26 ENCOUNTER — Telehealth: Payer: Self-pay | Admitting: Orthopaedic Surgery

## 2022-09-26 DIAGNOSIS — Z969 Presence of functional implant, unspecified: Secondary | ICD-10-CM

## 2022-09-26 DIAGNOSIS — M1611 Unilateral primary osteoarthritis, right hip: Secondary | ICD-10-CM

## 2022-09-26 MED ORDER — TRAMADOL HCL 50 MG PO TABS: 50.0000 mg | ORAL_TABLET | Freq: Four times a day (QID) | ORAL | 0 refills | Status: DC | PRN

## 2022-09-26 NOTE — Progress Notes (Signed)
The patient is a very pleasant and active 72 year old female sent from Dr. Valentina Shaggy of [facility] to evaluate and treat arthritis involving the right hip in a patient that also has retained orthopedic hardware in that right hip.  She is very active.  She is now having significant right hip pain for over a year now and the pain is in the groin.  It is at the point where it is detrimentally affecting her mobility, her quality of life and her actives daily living.  She had cannulated screw fixation for a nondisplaced femoral neck fracture that occurred 30 years ago when she fell off of a cabinet.  She eventually healed the fracture and has not had any issues for a long period time when she slowly started developing right hip and groin pain.  X-rays on the canopy system and that accompany her show significant arthritis of the right hip with an irregular femoral head as well as joint space narrowing and sclerotic changes.  There is also some osteophytes around the hip.  There are 3 cannulated screws that are intact in that hip as well.  I did go over the x-rays with her in detail.  On exam she has stiff range of motion of that right hip and pain in the groin with internal and external rotation.  Her left hip moves smoothly and fluidly.  She walks with a slight limp as well.  Of note I did review all of her notes within epic in terms of past medical history and medications as well.  We had a long and thorough discussion about hip replacement surgery.  I did let her know that this would be a 2 incision approach in terms of 1 incision and remove the 3 screws and then a separate anterior incision for the hip replacement.  She understands this fully.  I showed her hip replacement model and gave her handout that describes hip replacement surgery.  We talked about the risks and benefits of the surgery and what to expect from an intraoperative postoperative course.  She is not obese and not a diabetic and not  a smoker.  All questions concerns were answered and addressed.  We will work on getting her on the schedule.

## 2022-09-26 NOTE — Telephone Encounter (Signed)
Patient called saying that she was waiting on her Tramadol to go the pharmacy. OZ#[phone]

## 2022-10-09 ENCOUNTER — Other Ambulatory Visit: Payer: Self-pay

## 2022-10-12 DIAGNOSIS — H353112 Nonexudative age-related macular degeneration, right eye, intermediate dry stage: Secondary | ICD-10-CM | POA: Diagnosis not present

## 2022-10-15 NOTE — Progress Notes (Signed)
Surgical Instructions   Your procedure is scheduled on Tuesday, 10/23/22. Report to [facility] Main Entrance "A" at 12:30 P.M., then check in with the Admitting office. Any questions or running late day of surgery: call [phone]  Questions prior to your surgery date: call [phone], Monday-Friday, 8am-4pm. If you experience any cold or flu symptoms such as cough, fever, chills, shortness of breath, etc. between now and your scheduled surgery, please notify us at the above number.     Remember:  Do not eat after midnight the night before your surgery  You may drink clear liquids until 11:30am the morning of your surgery.   Clear liquids allowed are: Water, Non-Citrus Juices (without pulp), Carbonated Beverages, Clear Tea, Black Coffee Only (NO MILK, CREAM OR POWDERED CREAMER of any kind), and Gatorade.  Patient Instructions  The night before surgery:  No food after midnight. ONLY clear liquids after midnight  The day of surgery (if you do NOT have diabetes):  Drink ONE (1) Pre-Surgery Clear Ensure by 11:30am the morning of surgery. Drink in one sitting. Do not sip.  This drink was given to you during your hospital  pre-op appointment visit. Nothing else to drink after completing the  Pre-Surgery Clear Ensure.          If you have questions, please contact your surgeon's office.     Take these medicines the morning of surgery with A SIP OF WATER  ALPRAZolam Prudy Feeler) (if your due for it that day) escitalopram (LEXAPRO)    May take these medicines IF NEEDED: traMADol (ULTRAM)    One week prior to surgery, STOP taking any Aspirin (unless otherwise instructed by your surgeon) Aleve, Naproxen, Ibuprofen, Motrin, Advil, Goody's, BC's, all herbal medications, fish oil, and non-prescription vitamins.                     Do NOT Smoke (Tobacco/Vaping) for 24 hours prior to your procedure.  If you use a CPAP at night, you may bring your mask/headgear for your overnight  stay.   You will be asked to remove any contacts, glasses, piercing's, hearing aid's, dentures/partials prior to surgery. Please bring cases for these items if needed.    Patients discharged the day of surgery will not be allowed to drive home, and someone needs to stay with them for 24 hours.  SURGICAL WAITING ROOM VISITATION Patients may have no more than 2 support people in the waiting area - these visitors may rotate.   Pre-op nurse will coordinate an appropriate time for 1 ADULT support person, who may not rotate, to accompany patient in pre-op.  Children under the age of 4 must have an adult with them who is not the patient and must remain in the main waiting area with an adult.  If the patient needs to stay at the hospital during part of their recovery, the visitor guidelines for inpatient rooms apply.  Please refer to the [facility] website for the visitor guidelines for any additional information.   If you received a COVID test during your pre-op visit  it is requested that you wear a mask when out in public, stay away from anyone that may not be feeling well and notify your surgeon if you develop symptoms. If you have been in contact with anyone that has tested positive in the last 10 days please notify you surgeon.      Pre-operative 5 CHG Bathing Instructions   You can play a key role in reducing the  risk of infection after surgery. Your skin needs to be as free of germs as possible. You can reduce the number of germs on your skin by washing with CHG (chlorhexidine gluconate) soap before surgery. CHG is an antiseptic soap that kills germs and continues to kill germs even after washing.   DO NOT use if you have an allergy to chlorhexidine/CHG or antibacterial soaps. If your skin becomes reddened or irritated, stop using the CHG and notify one of our RNs at [phone].   Please shower with the CHG soap starting 4 days before surgery using the following schedule:      Please keep in mind the following:  DO NOT shave, including legs and underarms, starting the day of your first shower.   You may shave your face at any point before/day of surgery.  Place clean sheets on your bed the day you start using CHG soap. Use a clean washcloth (not used since being washed) for each shower. DO NOT sleep with pets once you start using the CHG.   CHG Shower Instructions:  Wash your face and private area with normal soap. If you choose to wash your hair, wash first with your normal shampoo.  After you use shampoo/soap, rinse your hair and body thoroughly to remove shampoo/soap residue.  Turn the water OFF and apply about 3 tablespoons (45 ml) of CHG soap to a CLEAN washcloth.  Apply CHG soap ONLY FROM YOUR NECK DOWN TO YOUR TOES (washing for 3-5 minutes)  DO NOT use CHG soap on face, private areas, open wounds, or sores.  Pay special attention to the area where your surgery is being performed.  If you are having back surgery, having someone wash your back for you may be helpful. Wait 2 minutes after CHG soap is applied, then you may rinse off the CHG soap.  Pat dry with a clean towel  Put on clean clothes/pajamas   If you choose to wear lotion, please use ONLY the CHG-compatible lotions on the back of this paper.   Additional instructions for the day of surgery: DO NOT APPLY any lotions, deodorants, cologne, or perfumes.   Do not bring valuables to the hospital. [facility] is not responsible for any belongings/valuables. Do not wear nail polish, gel polish, artificial nails, or any other type of covering on natural nails (fingers and toes) Do not wear jewelry or makeup Put on clean/comfortable clothes.  Please brush your teeth.  Ask your nurse before applying any prescription medications to the skin.     CHG Compatible Lotions   Aveeno Moisturizing lotion  Cetaphil Moisturizing Cream  Cetaphil Moisturizing Lotion  Clairol Herbal Essence Moisturizing  Lotion, Dry Skin  Clairol Herbal Essence Moisturizing Lotion, Extra Dry Skin  Clairol Herbal Essence Moisturizing Lotion, Normal Skin  Curel Age Defying Therapeutic Moisturizing Lotion with Alpha Hydroxy  Curel Extreme Care Body Lotion  Curel Soothing Hands Moisturizing Hand Lotion  Curel Therapeutic Moisturizing Cream, Fragrance-Free  Curel Therapeutic Moisturizing Lotion, Fragrance-Free  Curel Therapeutic Moisturizing Lotion, Original Formula  Eucerin Daily Replenishing Lotion  Eucerin Dry Skin Therapy Plus Alpha Hydroxy Crme  Eucerin Dry Skin Therapy Plus Alpha Hydroxy Lotion  Eucerin Original Crme  Eucerin Original Lotion  Eucerin Plus Crme Eucerin Plus Lotion  Eucerin TriLipid Replenishing Lotion  Keri Anti-Bacterial Hand Lotion  Keri Deep Conditioning Original Lotion Dry Skin Formula Softly Scented  Keri Deep Conditioning Original Lotion, Fragrance Free Sensitive Skin Formula  Keri Lotion Fast Absorbing Fragrance Free Sensitive Skin Formula  Keri Lotion Fast Absorbing Softly Scented Dry Skin Formula  Keri Original Lotion  Keri Skin Renewal Lotion Keri Silky Smooth Lotion  Keri Silky Smooth Sensitive Skin Lotion  Nivea Body Creamy Conditioning Patent examiner Moisturizing Lotion Nivea Crme  Nivea Skin Firming Lotion  NutraDerm 30 Skin Lotion  NutraDerm Skin Lotion  NutraDerm Therapeutic Skin Cream  NutraDerm Therapeutic Skin Lotion  ProShield Protective Hand Cream  Provon moisturizing lotion  Please read over the following fact sheets that you were given.

## 2022-10-16 ENCOUNTER — Other Ambulatory Visit: Payer: Self-pay

## 2022-10-16 ENCOUNTER — Encounter (HOSPITAL_COMMUNITY)
Admission: RE | Admit: 2022-10-16 | Discharge: 2022-10-16 | Disposition: A | Discharge status: Home / Self Care | Payer: Medicare PPO | Source: Ambulatory Visit | Attending: Orthopaedic Surgery | Admitting: Orthopaedic Surgery

## 2022-10-16 ENCOUNTER — Encounter (HOSPITAL_COMMUNITY): Payer: Self-pay

## 2022-10-16 VITALS — BP 141/53 | HR 61 | Temp 97.7°F | Resp 18 | Ht 63.0 in | Wt 164.9 lb

## 2022-10-16 DIAGNOSIS — M1611 Unilateral primary osteoarthritis, right hip: Secondary | ICD-10-CM

## 2022-10-16 DIAGNOSIS — Z01810 Encounter for preprocedural cardiovascular examination: Secondary | ICD-10-CM | POA: Diagnosis present

## 2022-10-16 DIAGNOSIS — Z01818 Encounter for other preprocedural examination: Secondary | ICD-10-CM

## 2022-10-16 DIAGNOSIS — Z01812 Encounter for preprocedural laboratory examination: Secondary | ICD-10-CM | POA: Diagnosis present

## 2022-10-16 HISTORY — DX: Unspecified osteoarthritis, unspecified site: M19.90

## 2022-10-16 HISTORY — DX: Other specified postprocedural states: R11.2

## 2022-10-16 HISTORY — DX: Nausea with vomiting, unspecified: Z98.890

## 2022-10-16 LAB — TYPE AND SCREEN
ABO/RH(D): O POS
Antibody Screen: NEGATIVE

## 2022-10-16 LAB — COMPREHENSIVE METABOLIC PANEL
ALT: 19 U/L (ref 0–44)
AST: 23 U/L (ref 15–41)
Albumin: 4.1 g/dL (ref 3.5–5.0)
Alkaline Phosphatase: 59 U/L (ref 38–126)
Anion gap: 10 (ref 5–15)
BUN: 17 mg/dL (ref 8–23)
CO2: 27 mmol/L (ref 22–32)
Calcium: 9.6 mg/dL (ref 8.9–10.3)
Chloride: 104 mmol/L (ref 98–111)
Creatinine, Ser: 0.92 mg/dL (ref 0.44–1.00)
GFR, Estimated: >60 mL/min (ref >60)
Glucose, Bld: 109 mg/dL — ABNORMAL HIGH (ref 70–99)
Potassium: 3.9 mmol/L (ref 3.5–5.1)
Sodium: 141 mmol/L (ref 135–145)
Total Bilirubin: 0.7 mg/dL (ref 0.3–1.2)
Total Protein: 7.2 g/dL (ref 6.5–8.1)

## 2022-10-16 LAB — CBC
HCT: 42.7 % (ref 36.0–46.0)
Hemoglobin: 14.1 g/dL (ref 12.0–15.0)
MCH: 31.3 pg (ref 26.0–34.0)
MCHC: 33 g/dL (ref 30.0–36.0)
MCV: 94.9 fL (ref 80.0–100.0)
Platelets: 280 10*3/uL (ref 150–400)
RBC: 4.5 MIL/uL (ref 3.87–5.11)
RDW: 12.3 % (ref 11.5–15.5)
WBC: 6.1 10*3/uL (ref 4.0–10.5)
nRBC: 0 % (ref 0.0–0.2)

## 2022-10-16 LAB — SURGICAL PCR SCREEN
MRSA, PCR: NEGATIVE
Staphylococcus aureus: NEGATIVE

## 2022-10-16 NOTE — Progress Notes (Signed)
PCP - Carylon Perches, MD Cardiologist - denies  PPM/ICD - denies Device Orders - n/a Rep Notified - n/a  Chest x-ray - denies EKG - denies Stress Test - denies ECHO - denies Cardiac Cath - denies  Sleep Study - denies CPAP - n/a  Fasting Blood Sugar - no DM Checks Blood Sugar _____ times a day  Last dose of GLP1 agonist-  n/a GLP1 instructions: n/a  Blood Thinner Instructions: n/a Aspirin Instructions: n/a  ERAS Protcol -yes  PRE-SURGERY Ensure or G2- Ensure  COVID TEST- n/a   Anesthesia review: yes; per pt she has a heart murmur since 1971. No recent cardiac studies. Per pt "PCP hardly heard it on my last appt." No chest pain, no SOB, no irregular HR. Hannah Poles, PA is aware. Recent notes are requested from PCP.   Patient denies shortness of breath, fever, cough and chest pain at PAT appointment   All instructions explained to the patient, with a verbal understanding of the material. Patient agrees to go over the instructions while at home for a better understanding. Patient also instructed to self quarantine after being tested for COVID-19. The opportunity to ask questions was provided.

## 2022-10-22 ENCOUNTER — Telehealth: Payer: Self-pay | Admitting: *Deleted

## 2022-10-22 NOTE — Care Plan (Signed)
[facility] call to patient to discuss her upcoming Right hip hardware removal with total hip replacement on 10/23/22. Her plan is to return to her home with her daughter assisting her. She will need a RW before discharge from hospital. Anticipate HHPT will be needed after a short hospital stay. Referral made to [facility] after choice provided. Reviewed all post op care instructions. Will continue to follow for needs.

## 2022-10-22 NOTE — Plan of Care (Signed)
CHL Tonsillectomy/Adenoidectomy, Postoperative PEDS care plan entered in error.

## 2022-10-22 NOTE — Telephone Encounter (Signed)
[facility] pre-op call completed. 

## 2022-10-22 NOTE — H&P (Signed)
Hannah Contreras Hannah Contreras is an 72 y.o. female.   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  She underwent cannulated screw fixation of her right hip after a nondisplaced femoral neck fracture way back in 2001.  She is actually done well for a long period time but over the last year has developed worsening right hip and groin pain.  X-rays have shown the cannulated screws are retained in the right hip but she has developed arthritic changes.  At this point she has tried and failed conservative treatment and her right hip pain is daily.  It is detrimentally affecting her mobility, her quality of life and her actives of daily living enough that she wishes to proceed with a total hip arthroplasty at this point.  Past Medical History:  Diagnosis Date   Anxiety    Arthritis    hip OA   Atypical nevus 06/14/2015   Right Post Shoulder - Mild   Depression    Heart murmur 1971   Macular degeneration    PONV (postoperative nausea and vomiting)     Past Surgical History:  Procedure Laterality Date   CESAREAN SECTION     CHOLECYSTECTOMY     COLONOSCOPY     normal   COLONOSCOPY WITH PROPOFOL N/A 11/18/2020   Procedure: COLONOSCOPY WITH PROPOFOL;  Surgeon: Lanelle Bal, DO;  Location: [facility];  Service: Endoscopy;  Laterality: N/A;  12:00pm   HIP SURGERY Right 01/02/1999   OTHER SURGICAL HISTORY     history of multiple fractures.  right hip., left arm and elbow, right arm.     Family History  Problem Relation Age of Onset   Heart disease Mother    Heart attack Father    Rheum arthritis Father    Other Son        acoustic neuroma   Social History:  reports that she has quit smoking. Her smoking use included cigarettes. She has never used smokeless tobacco. She reports current alcohol use. She reports that she does not use drugs.  Allergies:  Allergies  Allergen Reactions   Codeine  Nausea Only   Penicillins Rash    Childhood allergy    No medications prior to admission.    No results found for this or any previous visit (from the past 48 hour(s)). No results found.  Review of Systems  All other systems reviewed and are negative.   There were no vitals taken for this visit. Physical Exam Vitals reviewed.  Constitutional:      Appearance: Normal appearance. She is normal weight.  HENT:     Head: Normocephalic and atraumatic.  Eyes:     Extraocular Movements: Extraocular movements intact.     Pupils: Pupils are equal, round, and reactive to light.  Cardiovascular:     Rate and Rhythm: Normal rate.     Pulses: Normal pulses.  Pulmonary:     Effort: Pulmonary effort is normal.     Breath sounds: Normal breath sounds.  Abdominal:     Palpations: Abdomen is soft.  Musculoskeletal:     Cervical back: Normal range of motion and neck supple.     Right hip: Tenderness and bony tenderness present. Decreased range of motion. Decreased strength.  Neurological:     Mental Status: She is alert and oriented to person, place, and time.  Psychiatric:        Behavior: Behavior normal.  Assessment/Plan Right hip osteoarthritis with retained orthopedic hardware (cannulated screws x 3)  The plan is to proceed to surgery for removal of the 3 cannulated screws from her right hip and then proceeding with a right direct anterior total hip arthroplasty.  She understands this will be through 2 separate incisions in order to remove the hardware first and then proceed with a hip replacement.  The risks and benefits of surgery been discussed in significant detail.  We talked about what to expect from an intraoperative and postoperative standpoint.  Kathryne Hitch, MD 10/22/2022, 8:20 PM

## 2022-10-22 NOTE — Progress Notes (Signed)
Pt aware of new arrival time for surgery at 0740 and ERAS [phone]

## 2022-10-23 ENCOUNTER — Ambulatory Visit (HOSPITAL_BASED_OUTPATIENT_CLINIC_OR_DEPARTMENT_OTHER): Payer: Self-pay | Admitting: Certified Registered Nurse Anesthetist

## 2022-10-23 ENCOUNTER — Observation Stay (HOSPITAL_COMMUNITY)
Admission: RE | Admit: 2022-10-23 | Discharge: 2022-10-24 | Disposition: A | Discharge status: Home / Self Care | Payer: Medicare PPO | Attending: Orthopaedic Surgery | Admitting: Orthopaedic Surgery

## 2022-10-23 ENCOUNTER — Encounter (HOSPITAL_COMMUNITY)
Admission: RE | Disposition: A | Discharge status: Home / Self Care | Payer: Self-pay | Source: Home / Self Care | Attending: Orthopaedic Surgery

## 2022-10-23 ENCOUNTER — Encounter (HOSPITAL_COMMUNITY): Payer: Self-pay | Admitting: Orthopaedic Surgery

## 2022-10-23 ENCOUNTER — Other Ambulatory Visit: Payer: Self-pay

## 2022-10-23 ENCOUNTER — Ambulatory Visit (HOSPITAL_COMMUNITY): Payer: Medicare PPO

## 2022-10-23 ENCOUNTER — Ambulatory Visit (HOSPITAL_COMMUNITY): Payer: Medicare PPO | Admitting: Physician Assistant

## 2022-10-23 DIAGNOSIS — Z87891 Personal history of nicotine dependence: Secondary | ICD-10-CM | POA: Insufficient documentation

## 2022-10-23 DIAGNOSIS — Y828 Other medical devices associated with adverse incidents: Secondary | ICD-10-CM | POA: Insufficient documentation

## 2022-10-23 DIAGNOSIS — M1611 Unilateral primary osteoarthritis, right hip: Secondary | ICD-10-CM | POA: Diagnosis not present

## 2022-10-23 DIAGNOSIS — T84498A Other mechanical complication of other internal orthopedic devices, implants and grafts, initial encounter: Secondary | ICD-10-CM | POA: Insufficient documentation

## 2022-10-23 DIAGNOSIS — Z96641 Presence of right artificial hip joint: Secondary | ICD-10-CM

## 2022-10-23 DIAGNOSIS — M1651 Unilateral post-traumatic osteoarthritis, right hip: Secondary | ICD-10-CM | POA: Diagnosis not present

## 2022-10-23 DIAGNOSIS — Z472 Encounter for removal of internal fixation device: Secondary | ICD-10-CM | POA: Diagnosis not present

## 2022-10-23 HISTORY — PX: TOTAL HIP ARTHROPLASTY: SHX124

## 2022-10-23 HISTORY — PX: HARDWARE REMOVAL: SHX979

## 2022-10-23 LAB — ABO/RH: ABO/RH(D): O POS

## 2022-10-23 SURGERY — ARTHROPLASTY, HIP, TOTAL, ANTERIOR APPROACH
Anesthesia: Spinal | Site: Hip | Laterality: Right

## 2022-10-23 MED ORDER — METOCLOPRAMIDE HCL 5 MG PO TABS: 5.0000 mg | ORAL_TABLET | Freq: Three times a day (TID) | ORAL | Status: DC | PRN

## 2022-10-23 MED ORDER — TRAMADOL HCL 50 MG PO TABS
100.0000 mg | ORAL_TABLET | Freq: Four times a day (QID) | ORAL | Status: DC | PRN
  Administered 2022-10-23 – 2022-10-24 (×2): 100 mg via ORAL
  Filled 2022-10-23 (×2): qty 2

## 2022-10-23 MED ORDER — DOCUSATE SODIUM 100 MG PO CAPS
100.0000 mg | ORAL_CAPSULE | Freq: Two times a day (BID) | ORAL | Status: DC
  Administered 2022-10-23 – 2022-10-24 (×2): 100 mg via ORAL
  Filled 2022-10-23 (×2): qty 1

## 2022-10-23 MED ORDER — ACETAMINOPHEN 325 MG PO TABS: 325.0000 mg | ORAL_TABLET | Freq: Four times a day (QID) | ORAL | Status: DC | PRN

## 2022-10-23 MED ORDER — ALUM & MAG HYDROXIDE-SIMETH 200-200-20 MG/5ML PO SUSP: 30.0000 mL | ORAL | Status: DC | PRN

## 2022-10-23 MED ORDER — METOCLOPRAMIDE HCL 5 MG/ML IJ SOLN: 5.0000 mg | Freq: Three times a day (TID) | INTRAMUSCULAR | Status: DC | PRN

## 2022-10-23 MED ORDER — DIPHENHYDRAMINE HCL 12.5 MG/5ML PO ELIX
12.5000 mg | ORAL_SOLUTION | ORAL | Status: DC | PRN
Start: 2022-10-23 — End: 2022-10-24

## 2022-10-23 MED ORDER — FENTANYL CITRATE (PF) 250 MCG/5ML IJ SOLN
INTRAMUSCULAR | Status: AC
  Filled 2022-10-23: qty 5

## 2022-10-23 MED ORDER — SODIUM CHLORIDE 0.9 % IR SOLN
Status: DC | PRN
  Administered 2022-10-23: 1000 mL

## 2022-10-23 MED ORDER — GABAPENTIN 100 MG PO CAPS
100.0000 mg | ORAL_CAPSULE | Freq: Every day | ORAL | Status: DC
  Administered 2022-10-23: 100 mg via ORAL
  Filled 2022-10-23: qty 1

## 2022-10-23 MED ORDER — SODIUM CHLORIDE 0.9 % IV SOLN: INTRAVENOUS | Status: DC | PRN

## 2022-10-23 MED ORDER — CHLORHEXIDINE GLUCONATE 0.12 % MT SOLN
15.0000 mL | Freq: Once | OROMUCOSAL | Status: AC
  Administered 2022-10-23: 15 mL via OROMUCOSAL
  Filled 2022-10-23: qty 15

## 2022-10-23 MED ORDER — HYDROMORPHONE HCL 1 MG/ML IJ SOLN
0.5000 mg | INTRAMUSCULAR | Status: DC | PRN
Start: 2022-10-23 — End: 2022-10-24

## 2022-10-23 MED ORDER — MIDAZOLAM HCL 2 MG/2ML IJ SOLN
INTRAMUSCULAR | Status: DC | PRN
  Administered 2022-10-23 (×2): 1 mg via INTRAVENOUS

## 2022-10-23 MED ORDER — PHENYLEPHRINE HCL-NACL 20-0.9 MG/250ML-% IV SOLN
INTRAVENOUS | Status: DC | PRN
  Administered 2022-10-23: 50 ug/min via INTRAVENOUS

## 2022-10-23 MED ORDER — OXYCODONE HCL 5 MG PO TABS
5.0000 mg | ORAL_TABLET | ORAL | Status: DC | PRN
Start: 2022-10-23 — End: 2022-10-24
  Administered 2022-10-23: 5 mg via ORAL
  Filled 2022-10-23 (×2): qty 1

## 2022-10-23 MED ORDER — FENTANYL CITRATE (PF) 250 MCG/5ML IJ SOLN
INTRAMUSCULAR | Status: DC | PRN
  Administered 2022-10-23 (×2): 50 ug via INTRAVENOUS

## 2022-10-23 MED ORDER — ACETAMINOPHEN 325 MG PO TABS
325.0000 mg | ORAL_TABLET | Freq: Four times a day (QID) | ORAL | Status: DC | PRN
  Administered 2022-10-23: 650 mg via ORAL
  Filled 2022-10-23: qty 2

## 2022-10-23 MED ORDER — POVIDONE-IODINE 10 % EX SWAB
2.0000 | Freq: Once | CUTANEOUS | Status: AC
  Administered 2022-10-23: 2 via TOPICAL

## 2022-10-23 MED ORDER — EPHEDRINE SULFATE-NACL 50-0.9 MG/10ML-% IV SOSY
PREFILLED_SYRINGE | INTRAVENOUS | Status: DC | PRN
  Administered 2022-10-23: 5 mg via INTRAVENOUS
  Administered 2022-10-23 (×2): 10 mg via INTRAVENOUS

## 2022-10-23 MED ORDER — ONDANSETRON HCL 4 MG/2ML IJ SOLN: 4.0000 mg | Freq: Once | INTRAMUSCULAR | Status: DC | PRN

## 2022-10-23 MED ORDER — PROPOFOL 10 MG/ML IV BOLUS
INTRAVENOUS | Status: DC | PRN
  Administered 2022-10-23: 20 mg via INTRAVENOUS

## 2022-10-23 MED ORDER — MIDAZOLAM HCL 2 MG/2ML IJ SOLN
INTRAMUSCULAR | Status: AC
  Filled 2022-10-23: qty 2

## 2022-10-23 MED ORDER — PROPOFOL 500 MG/50ML IV EMUL
INTRAVENOUS | Status: DC | PRN
  Administered 2022-10-23: 200 ug/kg/min via INTRAVENOUS

## 2022-10-23 MED ORDER — PANTOPRAZOLE SODIUM 40 MG PO TBEC
40.0000 mg | DELAYED_RELEASE_TABLET | Freq: Every day | ORAL | Status: DC
  Administered 2022-10-24: 40 mg via ORAL
  Filled 2022-10-23 (×2): qty 1

## 2022-10-23 MED ORDER — TRANEXAMIC ACID-NACL 1000-0.7 MG/100ML-% IV SOLN
1000.0000 mg | INTRAVENOUS | Status: AC
  Administered 2022-10-23: 1000 mg via INTRAVENOUS
  Filled 2022-10-23: qty 100

## 2022-10-23 MED ORDER — OXYCODONE HCL 5 MG PO TABS
10.0000 mg | ORAL_TABLET | ORAL | Status: DC | PRN
Start: 2022-10-23 — End: 2022-10-24

## 2022-10-23 MED ORDER — ESCITALOPRAM OXALATE 10 MG PO TABS
20.0000 mg | ORAL_TABLET | Freq: Every day | ORAL | Status: DC
  Administered 2022-10-23 – 2022-10-24 (×2): 20 mg via ORAL
  Filled 2022-10-23 (×2): qty 2

## 2022-10-23 MED ORDER — METHOCARBAMOL 1000 MG/10ML IJ SOLN: 500.0000 mg | Freq: Four times a day (QID) | INTRAMUSCULAR | Status: DC | PRN

## 2022-10-23 MED ORDER — SODIUM CHLORIDE 0.9 % IV SOLN: INTRAVENOUS | Status: DC

## 2022-10-23 MED ORDER — PHENOL 1.4 % MT LIQD: 1.0000 | OROMUCOSAL | Status: DC | PRN

## 2022-10-23 MED ORDER — 0.9 % SODIUM CHLORIDE (POUR BTL) OPTIME
TOPICAL | Status: DC | PRN
  Administered 2022-10-23: 1000 mL

## 2022-10-23 MED ORDER — ACETAMINOPHEN 500 MG PO TABS
1000.0000 mg | ORAL_TABLET | Freq: Once | ORAL | Status: AC
  Administered 2022-10-23: 1000 mg via ORAL
  Filled 2022-10-23: qty 2

## 2022-10-23 MED ORDER — FENTANYL CITRATE (PF) 100 MCG/2ML IJ SOLN
25.0000 ug | INTRAMUSCULAR | Status: DC | PRN
Start: 2022-10-23 — End: 2022-10-23

## 2022-10-23 MED ORDER — ORAL CARE MOUTH RINSE: 15.0000 mL | Freq: Once | OROMUCOSAL | Status: AC

## 2022-10-23 MED ORDER — DEXAMETHASONE SODIUM PHOSPHATE 10 MG/ML IJ SOLN
INTRAMUSCULAR | Status: AC
  Filled 2022-10-23: qty 1

## 2022-10-23 MED ORDER — ASPIRIN 81 MG PO CHEW
81.0000 mg | CHEWABLE_TABLET | Freq: Two times a day (BID) | ORAL | Status: DC
  Administered 2022-10-23 – 2022-10-24 (×2): 81 mg via ORAL
  Filled 2022-10-23 (×2): qty 1

## 2022-10-23 MED ORDER — EPHEDRINE 5 MG/ML INJ
INTRAVENOUS | Status: AC
  Filled 2022-10-23: qty 5

## 2022-10-23 MED ORDER — CEFAZOLIN SODIUM-DEXTROSE 1-4 GM/50ML-% IV SOLN
1.0000 g | Freq: Four times a day (QID) | INTRAVENOUS | Status: AC
  Administered 2022-10-23 (×2): 1 g via INTRAVENOUS
  Filled 2022-10-23 (×2): qty 50

## 2022-10-23 MED ORDER — DEXAMETHASONE SODIUM PHOSPHATE 10 MG/ML IJ SOLN
INTRAMUSCULAR | Status: DC | PRN
  Administered 2022-10-23: 10 mg via INTRAVENOUS

## 2022-10-23 MED ORDER — METHOCARBAMOL 500 MG PO TABS
500.0000 mg | ORAL_TABLET | Freq: Four times a day (QID) | ORAL | Status: DC | PRN
  Administered 2022-10-23 (×2): 500 mg via ORAL
  Filled 2022-10-23 (×2): qty 1

## 2022-10-23 MED ORDER — MENTHOL 3 MG MT LOZG: 1.0000 | LOZENGE | OROMUCOSAL | Status: DC | PRN

## 2022-10-23 MED ORDER — CEFAZOLIN SODIUM-DEXTROSE 2-4 GM/100ML-% IV SOLN
2.0000 g | INTRAVENOUS | Status: AC
  Administered 2022-10-23: 2 g via INTRAVENOUS
  Filled 2022-10-23: qty 100

## 2022-10-23 MED ORDER — ONDANSETRON HCL 4 MG/2ML IJ SOLN: 4.0000 mg | Freq: Four times a day (QID) | INTRAMUSCULAR | Status: DC | PRN

## 2022-10-23 MED ORDER — LACTATED RINGERS IV SOLN: INTRAVENOUS | Status: DC

## 2022-10-23 MED ORDER — BUPIVACAINE IN DEXTROSE 0.75-8.25 % IT SOLN
INTRATHECAL | Status: DC | PRN
  Administered 2022-10-23: 1.8 mL via INTRATHECAL

## 2022-10-23 MED ORDER — ONDANSETRON HCL 4 MG PO TABS
4.0000 mg | ORAL_TABLET | Freq: Four times a day (QID) | ORAL | Status: DC | PRN
  Administered 2022-10-24: 4 mg via ORAL
  Filled 2022-10-23: qty 1

## 2022-10-23 SURGICAL SUPPLY — 80 items
APL SKNCLS STERI-STRIP NONHPOA (GAUZE/BANDAGES/DRESSINGS) ×2
BAG COUNTER SPONGE SURGICOUNT (BAG) ×2 IMPLANT
BAG SPNG CNTER NS LX DISP (BAG) ×2
BANDAGE ESMARK 6X9 LF (GAUZE/BANDAGES/DRESSINGS) IMPLANT
BENZOIN TINCTURE PRP APPL 2/3 (GAUZE/BANDAGES/DRESSINGS) ×2 IMPLANT
BLADE CLIPPER SURG (BLADE) IMPLANT
BLADE SAW SGTL 18X1.27X75 (BLADE) ×2 IMPLANT
BNDG CMPR 9X6 STRL LF SNTH (GAUZE/BANDAGES/DRESSINGS)
BNDG COHESIVE 4X5 TAN STRL (GAUZE/BANDAGES/DRESSINGS) IMPLANT
BNDG ELASTIC 4X5.8 VLCR STR LF (GAUZE/BANDAGES/DRESSINGS) IMPLANT
BNDG ELASTIC 6X5.8 VLCR STR LF (GAUZE/BANDAGES/DRESSINGS) IMPLANT
BNDG ESMARK 6X9 LF (GAUZE/BANDAGES/DRESSINGS)
BNDG GAUZE DERMACEA FLUFF 4 (GAUZE/BANDAGES/DRESSINGS) ×2 IMPLANT
BNDG GZE DERMACEA 4 6PLY (GAUZE/BANDAGES/DRESSINGS) ×2
COLLAR OFFSET CORAIL SZ 11 HIP (Stem) IMPLANT
CORAIL OFFSET COLLAR SZ 11 HIP (Stem) ×2 IMPLANT
COVER SURGICAL LIGHT HANDLE (MISCELLANEOUS) ×2 IMPLANT
CUFF TOURN SGL QUICK 34 (TOURNIQUET CUFF)
CUFF TOURN SGL QUICK 42 (TOURNIQUET CUFF) IMPLANT
CUFF TRNQT CYL 34X4.125X (TOURNIQUET CUFF) IMPLANT
CUP SECTOR GRIPTON 50MM (Cup) IMPLANT
DRAPE C-ARM 42X72 X-RAY (DRAPES) ×2 IMPLANT
DRAPE HALF SHEET 40X57 (DRAPES) IMPLANT
DRAPE INCISE IOBAN 66X45 STRL (DRAPES) IMPLANT
DRAPE ORTHO SPLIT 77X108 STRL (DRAPES)
DRAPE STERI IOBAN 125X83 (DRAPES) ×2 IMPLANT
DRAPE SURG ORHT 6 SPLT 77X108 (DRAPES) IMPLANT
DRAPE U-SHAPE 47X51 STRL (DRAPES) ×6 IMPLANT
DRSG AQUACEL AG ADV 3.5X10 (GAUZE/BANDAGES/DRESSINGS) ×2 IMPLANT
DRSG EMULSION OIL 3X3 NADH (GAUZE/BANDAGES/DRESSINGS) ×2 IMPLANT
DURAPREP 26ML APPLICATOR (WOUND CARE) ×2 IMPLANT
ELECT BLADE 4.0 EZ CLEAN MEGAD (MISCELLANEOUS) ×2
ELECT BLADE 6.5 EXT (BLADE) IMPLANT
ELECT REM PT RETURN 9FT ADLT (ELECTROSURGICAL) ×2
ELECTRODE BLDE 4.0 EZ CLN MEGD (MISCELLANEOUS) ×2 IMPLANT
ELECTRODE REM PT RTRN 9FT ADLT (ELECTROSURGICAL) ×2 IMPLANT
FACESHIELD WRAPAROUND (MASK) ×4
FACESHIELD WRAPAROUND OR TEAM (MASK) ×4 IMPLANT
GAUZE PAD ABD 8X10 STRL (GAUZE/BANDAGES/DRESSINGS) ×2 IMPLANT
GAUZE SPONGE 4X4 12PLY STRL (GAUZE/BANDAGES/DRESSINGS) ×2 IMPLANT
GLOVE BIO SURGEON STRL SZ8 (GLOVE) ×2 IMPLANT
GLOVE BIOGEL PI IND STRL 8 (GLOVE) ×4 IMPLANT
GLOVE ECLIPSE 8.0 STRL XLNG CF (GLOVE) ×2 IMPLANT
GLOVE ORTHO TXT STRL SZ7.5 (GLOVE) ×4 IMPLANT
GOWN STRL REUS W/ TWL LRG LVL3 (GOWN DISPOSABLE) ×6 IMPLANT
GOWN STRL REUS W/ TWL XL LVL3 (GOWN DISPOSABLE) ×4 IMPLANT
GOWN STRL REUS W/TWL LRG LVL3 (GOWN DISPOSABLE) ×6
GOWN STRL REUS W/TWL XL LVL3 (GOWN DISPOSABLE) ×4
HANDPIECE INTERPULSE COAX TIP (DISPOSABLE) ×2
HEAD FEM STD 32X+1 STRL (Hips) IMPLANT
KIT BASIN OR (CUSTOM PROCEDURE TRAY) ×2 IMPLANT
KIT TURNOVER KIT B (KITS) ×2 IMPLANT
LINER ACET PNNCL PLUS4 NEUTRAL (Hips) IMPLANT
MANIFOLD NEPTUNE II (INSTRUMENTS) ×2 IMPLANT
NS IRRIG 1000ML POUR BTL (IV SOLUTION) ×2 IMPLANT
PACK GENERAL/GYN (CUSTOM PROCEDURE TRAY) ×2 IMPLANT
PACK TOTAL JOINT (CUSTOM PROCEDURE TRAY) ×2 IMPLANT
PAD ARMBOARD 7.5X6 YLW CONV (MISCELLANEOUS) ×4 IMPLANT
PAD CAST 4YDX4 CTTN HI CHSV (CAST SUPPLIES) ×2 IMPLANT
PADDING CAST COTTON 4X4 STRL (CAST SUPPLIES) ×2
PINNACLE PLUS 4 NEUTRAL (Hips) ×2 IMPLANT
SET HNDPC FAN SPRY TIP SCT (DISPOSABLE) ×2 IMPLANT
STAPLER VISISTAT 35W (STAPLE) ×2 IMPLANT
STOCKINETTE IMPERVIOUS 9X36 MD (GAUZE/BANDAGES/DRESSINGS) IMPLANT
STRIP CLOSURE SKIN 1/2X4 (GAUZE/BANDAGES/DRESSINGS) ×4 IMPLANT
SUT ETHIBOND NAB CT1 #1 30IN (SUTURE) ×2 IMPLANT
SUT ETHILON 4 0 FS 1 (SUTURE) IMPLANT
SUT MNCRL AB 4-0 PS2 18 (SUTURE) IMPLANT
SUT VIC AB 0 CT1 27 (SUTURE) ×4
SUT VIC AB 0 CT1 27XBRD ANBCTR (SUTURE) ×2 IMPLANT
SUT VIC AB 1 CT1 27 (SUTURE) ×2
SUT VIC AB 1 CT1 27XBRD ANBCTR (SUTURE) ×2 IMPLANT
SUT VIC AB 2-0 CT1 27 (SUTURE) ×6
SUT VIC AB 2-0 CT1 TAPERPNT 27 (SUTURE) ×2 IMPLANT
TOWEL GREEN STERILE (TOWEL DISPOSABLE) ×2 IMPLANT
TOWEL GREEN STERILE FF (TOWEL DISPOSABLE) ×2 IMPLANT
TRAY CATH INTERMITTENT SS 16FR (CATHETERS) IMPLANT
TRAY FOLEY W/BAG SLVR 16FR (SET/KITS/TRAYS/PACK)
TRAY FOLEY W/BAG SLVR 16FR ST (SET/KITS/TRAYS/PACK) IMPLANT
WATER STERILE IRR 1000ML POUR (IV SOLUTION) ×4 IMPLANT

## 2022-10-23 NOTE — Op Note (Signed)
Operative Note  Date of operation: 10/23/2022 Preoperative diagnosis: #1 right hip posttraumatic osteoarthritis, #2 retained orthopedic hardware Postoperative diagnosis: Same  Procedure: #1 removal of cannulated screws x 3 from right hip, #2 right direct anterior total hip arthroplasty  Implants: Implant Name Type Inv. Item Serial No. Manufacturer Lot No. LRB No. Used Action  CUP SECTOR GRIPTON - [id number] Cup CUP SECTOR GRIPTON  [employer] [id number] Right 1 Implanted  PINNACLE PLUS 4 NEUTRAL - [id number] Hips PINNACLE PLUS 4 NEUTRAL  DEPUY ORTHOPAEDICS [id number] Right 1 Implanted  CORAIL OFFSET COLLAR SZ 11 HIP - [id number] Stem CORAIL OFFSET COLLAR SZ 11 HIP  DEPUY ORTHOPAEDICS [id number] Right 1 Implanted  HEAD FEM STD 32X+1 STRL - [id number] Hips HEAD FEM STD 32X+1 [employer]ol[employer] [id number] Right 1 Implanted   Surgeon: Vanita Panda. Magnus Ivan, MD Assistant: Caroline Sauger PA-C  Anesthesia: Spinal EBL: 300 cc Antibiotics: IV Ancef Complications: None  Indications: The patient is a 72 year old female who sustained a right hip femoral neck fracture 23 years ago after a hard mechanical fall.  She had cannulated screws placed to fix that fracture.  She has done well over a long period time but over the last few years has developed right hip pain.  She has x-rays showing arthritis in her hip at this point.  She has tried and failed conservative treatment.  Her right hip pain is daily and it is detrimentally affecting her mobility, her quality life and actives daily living to the point she does wish to proceed with a hip replacement on the right side.  We did talk about this need to be done through 2 separate incisions with a lateral incision and remove the 3 screws and then a direct anterior flex with a hip replacement.  She understands there is risk of acute loss anemia, nerve vessel injury, fracture, infection, DVT, implant failure, dislocation, leg length differences and  wound healing issues.  We talked about our goals being hopefully decreased pain, improved mobility and improved quality of life presently.  Procedure description:.   After informed consent was obtained and the appropriate right hip was marked, the patient was brought to the operating room and set up on the stretcher where spinal anesthesia was obtained.  She was laid in the supine position on stretcher and a Foley catheter was placed.  Traction boots were placed on both her feet and she was next placed supine on the Hana fracture table with a perineal post in place in both legs and inline skeletal traction devices but no traction applied.  Her right operative hip and pelvis were assessed radiographically with C-arm guidance the right hip was prepped and draped with DuraPrep and sterile drapes.  A timeout was called and she was then close correct patient and the correct right hip.  An incision was then made over her previous lateral hip incision and we dissected down to the screws.  All 3 screws were easily identified and we removed all 3 screws without difficulty.  We then closed at incision with a #1 Vicryl to close the iliotibial band and 0 Vicryl for the deep tissue with 2-0 Vicryl close here for change tissue.  Staples used to close that skin incision.  We then proceeded with a direct interposed lips and we are able to easily move the screws.  An incision was made just inferior and posterior to the ASIS and carried slightly obliquely down the leg.  Dissection was carried down to the tensor fascia  lata muscle and the tensor fascia was then divided longitudinally to proceed with a direct anterior approach to the hip.  Circumflex vessels were identified and cauterized.  The hip capsule was identified and opened up in L-type format.  Cobra retractors were placed around the medial and lateral femoral neck and a femoral neck cut was made with a Hossein saw just proximal to the lesser trochanter.  This cut was  completed with an osteotome.  A corkscrew guide was placed in the femoral head and the femoral head was removed its entirety and there was a wide area devoid of cartilage.  A bent Hohmann was then placed over the medial acetabular rim and remnants of the acetabular labrum and other debris removed.  Reaming was then initiated from a size 43 reamer and stepwise increments going up to a size 49 reamer with all reamers placed under direct visualization and the last reamer placed under direct fluoroscopy in order to obtain the depth reaming, the inclination and the anteversion.  The real DePuy sector GRIPTION acetabular component size 50 was then placed without difficulty followed by a 32+4 polythene liner.  Attention was then turned to the femur.  With the right leg externally rotated to 120 degrees, extended and adducted, a Mueller retractor was placed medially and a Hohmann retractor behind the trochanter.  The lateral joint capsule was released and a box cutting osteotome used in her femoral canal.  Broaching was then initiated using the Corail broaching system from a size 8 going up to a size 11.  With a size 11 in place we trialed a standard offset femoral neck and a 32+1 trial hip ball.  The leg was brought over and up with traction and internal rotation reduced in the pelvis.  We felt like we needed just more offset.  The leg length seemed appropriate.  We dislocated the hip and removed the trial components.  We then placed the real femoral component size 11 with high offset followed by the real 32+1 metal hip ball.  Again this was reduced in the pelvis we are pleased with range of motion, offset, leg length and stability.  The soft tissue was irrigated with normal saline solution.  The joint capsule was closed interrupted #1 Ethibond suture followed by #1 Vicryl for the tensor fascia.  0 Vicryl was used for the deep tissue and 2-0 Vicryl was used to subcutaneous tissue.  The skin was closed with staples.  The  patient was taken recovery in stable condition.  Rexene Edison, PA-C did assist during the entire case remained in the end and his assistance was crucial for tissue retraction and management, helping guide implant placement and a layered closure of the wound.

## 2022-10-23 NOTE — Anesthesia Procedure Notes (Signed)
Spinal  Patient location during procedure: OR Start time: 10/23/2022 10:34 AM End time: 10/23/2022 10:37 AM Reason for block: surgical anesthesia Staffing Performed: anesthesiologist  Anesthesiologist: Collene Schlichter, MD Performed by: Collene Schlichter, MD Authorized by: Collene Schlichter, MD   Preanesthetic Checklist Completed: patient identified, IV checked, risks and benefits discussed, surgical consent, monitors and equipment checked, pre-op evaluation and timeout performed Spinal Block Patient position: sitting Prep: DuraPrep and site prepped and draped Patient monitoring: continuous pulse ox and blood pressure Approach: midline Location: L3-4 Injection technique: single-shot Needle Needle type: Pencan  Needle gauge: 24 G Assessment Events: CSF return Additional Notes Functioning IV was confirmed and monitors were applied. Sterile prep and drape, including hand hygiene, mask and sterile gloves were used. The patient was positioned and the spine was prepped. The skin was anesthetized with lidocaine.  Free flow of clear CSF was obtained prior to injecting local anesthetic into the CSF.  The spinal needle aspirated freely following injection.  The needle was carefully withdrawn.  The patient tolerated the procedure well. Consent was obtained prior to procedure with all questions answered and concerns addressed. Risks including but not limited to bleeding, infection, nerve damage, paralysis, failed block, inadequate analgesia, allergic reaction, high spinal, itching and headache were discussed and the patient wished to proceed.   Arrie Aran, MD

## 2022-10-23 NOTE — Transfer of Care (Signed)
Immediate Anesthesia Transfer of Care Note  Patient: Hannah Contreras  Procedure(s) Performed: REMOVAL OF CANNULATED SCREWS RIGHT HIP, RIGHT TOTAL HIP ARTHROPLASTY ANTERIOR APPROACH (Right: Hip) HARDWARE REMOVAL (Right)  Patient Location: PACU  Anesthesia Type:Spinal  Level of Consciousness: awake, alert , and oriented  Airway & Oxygen Therapy: Patient Spontanous Breathing  Post-op Assessment: Report given to RN and Post -op Vital signs reviewed and stable  Post vital signs: Reviewed  Last Vitals:  Vitals Value Taken Time  BP 96/54 10/23/22 1245  Temp 98   Pulse 75 10/23/22 1246  Resp 16 10/23/22 1246  SpO2 99 % 10/23/22 1246  Vitals shown include unfiled device data.  Last Pain:  Vitals:   10/23/22 0811  TempSrc:   PainSc: 5       Patients Stated Pain Goal: 0 (10/23/22 6283)  Complications: There were no known notable events for this encounter.

## 2022-10-23 NOTE — Anesthesia Postprocedure Evaluation (Signed)
Anesthesia Post Note  Patient: Hannah Contreras  Procedure(s) Performed: REMOVAL OF CANNULATED SCREWS RIGHT HIP, RIGHT TOTAL HIP ARTHROPLASTY ANTERIOR APPROACH (Right: Hip) HARDWARE REMOVAL (Right)     Patient location during evaluation: PACU Anesthesia Type: Spinal Level of consciousness: awake, awake and alert and oriented Pain management: pain level controlled Vital Signs Assessment: post-procedure vital signs reviewed and stable Respiratory status: spontaneous breathing, nonlabored ventilation and respiratory function stable Cardiovascular status: blood pressure returned to baseline and stable Postop Assessment: no headache, no backache, spinal receding and no apparent nausea or vomiting Anesthetic complications: no   There were no known notable events for this encounter.  Last Vitals:  Vitals:   10/23/22 1330 10/23/22 1345  BP: (!) 98/55 (!) 93/59  Pulse: 64 65  Resp: 12 14  Temp: 36.5 C   SpO2: 96% 94%    Last Pain:  Vitals:   10/23/22 1345  TempSrc:   PainSc: 0-No pain                 Collene Schlichter

## 2022-10-23 NOTE — Anesthesia Procedure Notes (Signed)
Procedure Name: MAC Date/Time: 10/23/2022 10:39 AM  Performed by: Flonnie Hailstone, CRNAPre-anesthesia Checklist: Patient identified, Emergency Drugs available, Suction available, Patient being monitored and Timeout performed Oxygen Delivery Method: Simple face mask Placement Confirmation: positive ETCO2 Dental Injury: Teeth and Oropharynx as per pre-operative assessment

## 2022-10-23 NOTE — Anesthesia Preprocedure Evaluation (Addendum)
Anesthesia Evaluation  Patient identified by MRN, date of birth, ID band Patient awake    Reviewed: Allergy & Precautions, NPO status , Patient's Chart, lab work & pertinent test results  History of Anesthesia Complications (+) PONV and history of anesthetic complications  Airway Mallampati: III  TM Distance: >3 FB Neck ROM: Full    Dental  (+) Teeth Intact, Dental Advisory Given   Pulmonary former smoker   Pulmonary exam normal breath sounds clear to auscultation       Cardiovascular Exercise Tolerance: Good (-) angina negative cardio ROS Normal cardiovascular exam Rhythm:Regular Rate:Normal     Neuro/Psych  PSYCHIATRIC DISORDERS Anxiety Depression    negative neurological ROS     GI/Hepatic negative GI ROS, Neg liver ROS,,,  Endo/Other  negative endocrine ROS    Renal/GU negative Renal ROS     Musculoskeletal  (+) Arthritis ,   osteoarthritis right hip, retained orthopedic hardware right hip   Abdominal   Peds  Hematology negative hematology ROS (+) Plt 280k   Anesthesia Other Findings Day of surgery medications reviewed with the patient.  Reproductive/Obstetrics                             Anesthesia Physical Anesthesia Plan  ASA: 2  Anesthesia Plan: Spinal   Post-op Pain Management: Tylenol PO (pre-op)*   Induction: Intravenous  PONV Risk Score and Plan: 3 and TIVA, Dexamethasone and Ondansetron  Airway Management Planned: Natural Airway and Simple Face Mask  Additional Equipment:   Intra-op Plan:   Post-operative Plan:   Informed Consent: I have reviewed the patients History and Physical, chart, labs and discussed the procedure including the risks, benefits and alternatives for the proposed anesthesia with the patient or authorized representative who has indicated his/her understanding and acceptance.     Dental advisory given  Plan Discussed with:  CRNA  Anesthesia Plan Comments:        Anesthesia Quick Evaluation

## 2022-10-23 NOTE — Plan of Care (Signed)
  Problem: Education: Goal: Knowledge of General Education information will improve Description: Including pain rating scale, medication(s)/side effects and non-pharmacologic comfort measures Outcome: Progressing   Problem: Activity: Goal: Risk for activity intolerance will decrease Outcome: Progressing   Problem: Nutrition: Goal: Adequate nutrition will be maintained Outcome: Progressing   Problem: Coping: Goal: Level of anxiety will decrease Outcome: Progressing   Problem: Elimination: Goal: Will not experience complications related to bowel motility Outcome: Progressing   Problem: Pain Management: Goal: General experience of comfort will improve Outcome: Progressing

## 2022-10-23 NOTE — Evaluation (Signed)
Physical Therapy Evaluation Patient Details Name: Hannah Contreras MRN: [id number] DOB: 1950-06-24 Today's Date: 10/23/2022  History of Present Illness  72 y.o. female presents to [facility] hospital on 10/23/2022 for R THA. PMH includes prior R hip fx s/p pinning, impingement syndrome.  Clinical Impression  Pt reports to physical therapy with deficits in strength, mobility,and ROM in the RLE. The pt was able to ambulate household distances with the use of a RW. Ambulation was stopped due to the pt reports of nausea. PT emphasizes the importance of frequent mobility and the completion of THA exercises prescribed. PT will focus on completing stair and gait training the next session. The pt will benefit from skilled PT to improve remaining functional deficits.      If plan is discharge home, recommend the following: A little help with walking and/or transfers;A little help with bathing/dressing/bathroom;Assistance with cooking/housework;Assist for transportation;Help with stairs or ramp for entrance   Can travel by private vehicle        Equipment Recommendations Rolling walker (2 wheels)  Recommendations for Other Services       Functional Status Assessment Patient has had a recent decline in their functional status and demonstrates the ability to make significant improvements in function in a reasonable and predictable amount of time.     Precautions / Restrictions Precautions Precautions: Fall Precaution Comments: direct anterior THA Restrictions Weight Bearing Restrictions: Yes RLE Weight Bearing: Weight bearing as tolerated      Mobility  Bed Mobility Overal bed mobility: Needs Assistance Bed Mobility: Supine to Sit, Sit to Supine     Supine to sit: Supervision Sit to supine: Min assist (assist with RLE)        Transfers Overall transfer level: Needs assistance Equipment used: Rolling walker (2 wheels) Transfers: Sit to/from Stand Sit to Stand: Contact guard assist                 Ambulation/Gait Ambulation/Gait assistance: Contact guard assist Gait Distance (Feet): 100 Feet Assistive device: Rolling walker (2 wheels) Gait Pattern/deviations: Step-through pattern, Decreased step length - right Gait velocity: slow Gait velocity interpretation: <1.8 ft/sec, indicate of risk for recurrent falls      Stairs            Wheelchair Mobility     Tilt Bed    Modified Rankin (Stroke Patients Only)       Balance Overall balance assessment: Needs assistance Sitting-balance support: Bilateral upper extremity supported Sitting balance-Leahy Scale: Poor     Standing balance support: Bilateral upper extremity supported Standing balance-Leahy Scale: Poor                               Pertinent Vitals/Pain      Home Living Family/patient expects to be discharged to:: Private residence Living Arrangements: Children;Other relatives Available Help at Discharge: Family;Available PRN/intermittently Type of Home: House Home Access: Stairs to enter Entrance Stairs-Rails: Right Entrance Stairs-Number of Steps: 3     Home Equipment: None      Prior Function Prior Level of Function : Independent/Modified Independent                     Extremity/Trunk Assessment   Upper Extremity Assessment Upper Extremity Assessment: Overall WFL for tasks assessed    Lower Extremity Assessment Lower Extremity Assessment: RLE deficits/detail RLE Deficits / Details: generalized post-op weakness s/p THA    Cervical / Trunk Assessment Cervical /  Trunk Assessment: Normal  Communication   Communication Communication: No apparent difficulties Cueing Techniques: Verbal cues  Cognition                                                General Comments      Exercises     Assessment/Plan    PT Assessment Patient needs continued PT services  PT Problem List Decreased strength;Decreased range of  motion;Decreased activity tolerance;Decreased balance;Decreased mobility;Pain       PT Treatment Interventions Gait training;Stair training;Functional mobility training;Therapeutic activities;Therapeutic exercise;Balance training    PT Goals (Current goals can be found in the Care Plan section)  Acute Rehab PT Goals Patient Stated Goal: to return home PT Goal Formulation: With patient Time For Goal Achievement: 11/06/22 Potential to Achieve Goals: Good    Frequency Min 1X/week     Co-evaluation               AM-PAC PT "6 Clicks" Mobility  Outcome Measure Help needed turning from your back to your side while in a flat bed without using bedrails?: A Little Help needed moving from lying on your back to sitting on the side of a flat bed without using bedrails?: A Little Help needed moving to and from a bed to a chair (including a wheelchair)?: A Little Help needed standing up from a chair using your arms (e.g., wheelchair or bedside chair)?: A Little Help needed to walk in hospital room?: A Little Help needed climbing 3-5 steps with a railing? : Total 6 Click Score: 16    End of Session Equipment Utilized During Treatment: Gait belt Activity Tolerance: Patient tolerated treatment well (Pt became nauseous during ambulation) Patient left: in bed;with call bell/phone within reach;with family/visitor present Nurse Communication: Mobility status PT Visit Diagnosis: Other abnormalities of gait and mobility (R26.89);Unsteadiness on feet (R26.81);Muscle weakness (generalized) (M62.81);Pain Pain - Right/Left: Right Pain - part of body: Hip    Time: 1610-9604 PT Time Calculation (min) (ACUTE ONLY): 22 min   Charges:   PT Evaluation $PT Eval Low Complexity: 1 Low   PT General Charges $$ ACUTE PT VISIT: 1 Visit         Caryl Comes, SPT Acute rehab Office: [phone]  Caryl Comes 10/23/2022, 5:20 PM

## 2022-10-23 NOTE — Interval H&P Note (Signed)
History and Physical Interval Note: The patient understands that she is here today for a right total hip arthroplasty to treat her significant right hip arthritis.  She also understands that we need to remove some screws from her hip on that same side and then proceed with a hip replacement surgery.  There has been no acute or interval change in her medical status.  The risks and benefits of surgery been discussed in detail and informed consent has been obtained.  The right operative hip has been marked.  10/23/2022 9:35 AM  Hannah Contreras  has presented today for surgery, with the diagnosis of osteoarthritis right hip, retained orthopedic hardware right hip.  The various methods of treatment have been discussed with the patient and family. After consideration of risks, benefits and other options for treatment, the patient has consented to  Procedure(s): REMOVAL OF CANNULATED SCREWS RIGHT HIP, RIGHT TOTAL HIP ARTHROPLASTY ANTERIOR APPROACH (Right) HARDWARE REMOVAL (Right) as a surgical intervention.  The patient's history has been reviewed, patient examined, no change in status, stable for surgery.  I have reviewed the patient's chart and labs.  Questions were answered to the patient's satisfaction.     Kathryne Hitch

## 2022-10-24 ENCOUNTER — Encounter (HOSPITAL_COMMUNITY): Payer: Self-pay | Admitting: Orthopaedic Surgery

## 2022-10-24 DIAGNOSIS — M1651 Unilateral post-traumatic osteoarthritis, right hip: Secondary | ICD-10-CM | POA: Diagnosis not present

## 2022-10-24 DIAGNOSIS — T84498A Other mechanical complication of other internal orthopedic devices, implants and grafts, initial encounter: Secondary | ICD-10-CM | POA: Diagnosis not present

## 2022-10-24 DIAGNOSIS — Z87891 Personal history of nicotine dependence: Secondary | ICD-10-CM | POA: Diagnosis not present

## 2022-10-24 DIAGNOSIS — M1611 Unilateral primary osteoarthritis, right hip: Secondary | ICD-10-CM | POA: Diagnosis not present

## 2022-10-24 LAB — BASIC METABOLIC PANEL
Anion gap: 9 (ref 5–15)
BUN: 18 mg/dL (ref 8–23)
CO2: 21 mmol/L — ABNORMAL LOW (ref 22–32)
Calcium: 8.4 mg/dL — ABNORMAL LOW (ref 8.9–10.3)
Chloride: 103 mmol/L (ref 98–111)
Creatinine, Ser: 0.83 mg/dL (ref 0.44–1.00)
GFR, Estimated: >60 mL/min (ref >60)
Glucose, Bld: 132 mg/dL — ABNORMAL HIGH (ref 70–99)
Potassium: 4 mmol/L (ref 3.5–5.1)
Sodium: 133 mmol/L — ABNORMAL LOW (ref 135–145)

## 2022-10-24 LAB — CBC
HCT: 31.8 % — ABNORMAL LOW (ref 36.0–46.0)
Hemoglobin: 10.7 g/dL — ABNORMAL LOW (ref 12.0–15.0)
MCH: 31.2 pg (ref 26.0–34.0)
MCHC: 33.6 g/dL (ref 30.0–36.0)
MCV: 92.7 fL (ref 80.0–100.0)
Platelets: 240 10*3/uL (ref 150–400)
RBC: 3.43 MIL/uL — ABNORMAL LOW (ref 3.87–5.11)
RDW: 12.4 % (ref 11.5–15.5)
WBC: 9.7 10*3/uL (ref 4.0–10.5)
nRBC: 0 % (ref 0.0–0.2)

## 2022-10-24 MED ORDER — TRAMADOL HCL 50 MG PO TABS
50.0000 mg | ORAL_TABLET | Freq: Four times a day (QID) | ORAL | 0 refills | Status: DC | PRN
Start: 2022-10-24 — End: 2023-07-24

## 2022-10-24 MED ORDER — ASPIRIN 81 MG PO CHEW: 81.0000 mg | CHEWABLE_TABLET | Freq: Two times a day (BID) | ORAL | 0 refills | Status: DC

## 2022-10-24 MED ORDER — METHOCARBAMOL 500 MG PO TABS: 500.0000 mg | ORAL_TABLET | Freq: Four times a day (QID) | ORAL | 0 refills | Status: DC | PRN

## 2022-10-24 NOTE — Discharge Summary (Signed)
Patient ID: Hannah Contreras MRN: [id number] DOB/AGE: 05/04/1950 72 y.o.  Admit date: 10/23/2022 Discharge date: 10/24/2022  Admission Diagnoses:  Principal Problem:   Unilateral primary osteoarthritis, right hip Active Problems:   Status post total replacement of right hip   Discharge Diagnoses:  Same  Past Medical History:  Diagnosis Date   Anxiety    Arthritis    hip OA   Atypical nevus 06/14/2015   Right Post Shoulder - Mild   Depression    Heart murmur 1971   Macular degeneration    PONV (postoperative nausea and vomiting)     Surgeries: Procedure(s): REMOVAL OF CANNULATED SCREWS RIGHT HIP, RIGHT TOTAL HIP ARTHROPLASTY ANTERIOR APPROACH HARDWARE REMOVAL on 10/23/2022   Consultants:   Discharged Condition: Improved  Hospital Course: Hannah Contreras is an 72 y.o. female who was admitted 10/23/2022 for operative treatment ofUnilateral primary osteoarthritis, right hip. Patient has severe unremitting pain that affects sleep, daily activities, and work/hobbies. After pre-op clearance the patient was taken to the operating room on 10/23/2022 and underwent  Procedure(s): REMOVAL OF CANNULATED SCREWS RIGHT HIP, RIGHT TOTAL HIP ARTHROPLASTY ANTERIOR APPROACH HARDWARE REMOVAL.    Patient was given perioperative antibiotics:  Anti-infectives (From admission, onward)    Start     Dose/Rate Route Frequency Ordered Stop   10/23/22 1600  ceFAZolin (ANCEF) IVPB 1 g/50 mL premix        1 g 100 mL/hr over 30 Minutes Intravenous Every 6 hours 10/23/22 1427 10/23/22 2218   10/23/22 0827  ceFAZolin (ANCEF) IVPB 2g/100 mL premix        2 g 200 mL/hr over 30 Minutes Intravenous On call to O.R. 10/23/22 0748 10/23/22 1055        Patient was given sequential compression devices, early ambulation, and chemoprophylaxis to prevent DVT.  Patient benefited maximally from hospital stay and there were no complications.    Recent vital signs: Patient Vitals for the past 24 hrs:   BP Temp Temp src Pulse Resp SpO2  10/24/22 0757 104/60 98.4 F (36.9 C) Oral 61 -- 99 %  10/24/22 0516 (!) 105/59 98.5 F (36.9 C) Oral 69 18 96 %  10/23/22 2141 96/65 98.5 F (36.9 C) Oral 82 -- 97 %  10/23/22 1759 97/62 99.6 F (37.6 C) Oral 82 16 --  10/23/22 1407 (!) 92/54 97.8 F (36.6 C) Oral 63 16 96 %  10/23/22 1345 (!) 93/59 -- -- 65 14 94 %  10/23/22 1330 (!) 98/55 97.7 F (36.5 C) -- 64 12 96 %  10/23/22 1315 (!) 94/56 -- -- 67 11 95 %  10/23/22 1300 (!) 98/55 -- -- 70 13 97 %  10/23/22 1245 (!) 96/54 -- -- 79 20 98 %  10/23/22 1240 (!) 98/25 97.7 F (36.5 C) -- 77 14 98 %     Recent laboratory studies:  Recent Labs    10/24/22 0547  WBC 9.7  HGB 10.7*  HCT 31.8*  PLT 240  NA 133*  K 4.0  CL 103  CO2 21*  BUN 18  CREATININE 0.83  GLUCOSE 132*  CALCIUM 8.4*     Discharge Medications:   Allergies as of 10/24/2022       Reactions   Cefdinir Nausea Only   Codeine Nausea Only   Penicillins Rash   Childhood allergy        Medication List     TAKE these medications    ALPRAZolam 0.5 MG tablet Commonly known as: XANAX Take  0.5 mg by mouth 2 (two) times a week.   aspirin 81 MG chewable tablet Chew 1 tablet (81 mg total) by mouth 2 (two) times daily.   escitalopram 20 MG tablet Commonly known as: LEXAPRO Take 20 mg by mouth daily.   gabapentin 100 MG capsule Commonly known as: NEURONTIN Take 1 capsule (100 mg total) by mouth at bedtime.   ibuprofen 200 MG tablet Commonly known as: ADVIL Take 600 mg by mouth every 6 (six) hours as needed for mild pain or moderate pain.   KRILL OIL PO Take 1 tablet by mouth daily. Softgel   methocarbamol 500 MG tablet Commonly known as: ROBAXIN Take 1 tablet (500 mg total) by mouth every 6 (six) hours as needed for muscle spasms.   OVER THE COUNTER MEDICATION Take 1 tablet by mouth daily. Ageless sight   traMADol 50 MG tablet Commonly known as: ULTRAM Take 1-2 tablets (50-100 mg total) by mouth  every 6 (six) hours as needed. What changed:  how much to take when to take this reasons to take this               Durable Medical Equipment  (From admission, onward)           Start     Ordered   10/23/22 1428  DME 3 n 1  Once        10/23/22 1427   10/23/22 1428  DME Walker rolling  Once       Question Answer Comment  Walker: With 5 Inch Wheels   Patient needs a walker to treat with the following condition Status post total replacement of right hip      10/23/22 1427            Diagnostic Studies: DG HIP UNILAT WITH PELVIS 1V RIGHT  Result Date: 10/23/2022 CLINICAL DATA:  Elective surgery. Hardware removal and total right anterior hip. EXAM: DG HIP (WITH OR WITHOUT PELVIS) 1V RIGHT COMPARISON:  Radiograph 01/23/2021 FINDINGS: Five fluoroscopic spot views of the pelvis and right hip obtained in the operating room. Previous femoral neck screws are noted with subsequent right hip arthroplasty. Fluoroscopy time 19 seconds. Dose 1.96 mGy. IMPRESSION: Intraoperative fluoroscopy during right hip arthroplasty. Electronically Signed   By: Hannah Rutherford M.D.   On: 10/23/2022 16:24   DG C-Arm 1-60 Min-No Report  Result Date: 10/23/2022 Fluoroscopy was utilized by the requesting physician.  No radiographic interpretation.   DG C-Arm 1-60 Min-No Report  Result Date: 10/23/2022 Fluoroscopy was utilized by the requesting physician.  No radiographic interpretation.    Disposition: Discharge disposition: 01-Home or Self Care          Follow-up Information     Hannah Hitch, MD Follow up in 2 week(s).   Specialty: Orthopedic Surgery Contact information: [address] [place] [address] [phone]                  Signed: Kathryne Contreras 10/24/2022, 12:02 PM

## 2022-10-24 NOTE — Plan of Care (Signed)
  Problem: Education: Goal: Knowledge of General Education information will improve Description: Including pain rating scale, medication(s)/side effects and non-pharmacologic comfort measures Outcome: Progressing   Problem: Health Behavior/Discharge Planning: Goal: Ability to manage health-related needs will improve Outcome: Progressing   Problem: Elimination: Goal: Will not experience complications related to bowel motility Outcome: Progressing   

## 2022-10-24 NOTE — Progress Notes (Signed)
Subjective: 1 Day Post-Op Procedure(s) (LRB): REMOVAL OF CANNULATED SCREWS RIGHT HIP, RIGHT TOTAL HIP ARTHROPLASTY ANTERIOR APPROACH (Right) HARDWARE REMOVAL (Right) Patient reports pain as moderate.    Objective: Vital signs in last 24 hours: Temp:  [97.7 F (36.5 C)-99.6 F (37.6 C)] 98.5 F (36.9 C) (10/23 0516) Pulse Rate:  [63-82] 69 (10/23 0516) Resp:  [11-20] 18 (10/23 0516) BP: (92-117)/(25-65) 105/59 (10/23 0516) SpO2:  [94 %-98 %] 96 % (10/23 0516) Weight:  [72.6 kg] 72.6 kg (10/22 0802)  Intake/Output from previous day: 10/22 0701 - 10/23 0700 In: 1451.4 [I.V.:1401.4; IV Piggyback:50] Out: 775 [Urine:475; Blood:300] Intake/Output this shift: No intake/output data recorded.  Recent Labs    10/24/22 0547  HGB 10.7*   Recent Labs    10/24/22 0547  WBC 9.7  RBC 3.43*  HCT 31.8*  PLT 240   Recent Labs    10/24/22 0547  NA 133*  K 4.0  CL 103  CO2 21*  BUN 18  CREATININE 0.83  GLUCOSE 132*  CALCIUM 8.4*   No results for input(s): "LABPT", "INR" in the last 72 hours.  Sensation intact distally Intact pulses distally Dorsiflexion/Plantar flexion intact Incision: dressing C/D/I   Assessment/Plan: 1 Day Post-Op Procedure(s) (LRB): REMOVAL OF CANNULATED SCREWS RIGHT HIP, RIGHT TOTAL HIP ARTHROPLASTY ANTERIOR APPROACH (Right) HARDWARE REMOVAL (Right) Up with therapy      Kathryne Hitch 10/24/2022, 7:31 AM

## 2022-10-24 NOTE — Discharge Instructions (Signed)

## 2022-10-26 DIAGNOSIS — F32A Depression, unspecified: Secondary | ICD-10-CM | POA: Diagnosis not present

## 2022-10-26 DIAGNOSIS — F419 Anxiety disorder, unspecified: Secondary | ICD-10-CM | POA: Diagnosis not present

## 2022-10-26 DIAGNOSIS — Z471 Aftercare following joint replacement surgery: Secondary | ICD-10-CM | POA: Diagnosis not present

## 2022-10-26 DIAGNOSIS — H3530 Unspecified macular degeneration: Secondary | ICD-10-CM | POA: Diagnosis not present

## 2022-10-26 DIAGNOSIS — Z96641 Presence of right artificial hip joint: Secondary | ICD-10-CM | POA: Diagnosis not present

## 2022-10-29 DIAGNOSIS — H3530 Unspecified macular degeneration: Secondary | ICD-10-CM | POA: Diagnosis not present

## 2022-10-29 DIAGNOSIS — Z471 Aftercare following joint replacement surgery: Secondary | ICD-10-CM | POA: Diagnosis not present

## 2022-10-29 DIAGNOSIS — Z96641 Presence of right artificial hip joint: Secondary | ICD-10-CM | POA: Diagnosis not present

## 2022-10-29 DIAGNOSIS — F32A Depression, unspecified: Secondary | ICD-10-CM | POA: Diagnosis not present

## 2022-10-29 DIAGNOSIS — F419 Anxiety disorder, unspecified: Secondary | ICD-10-CM | POA: Diagnosis not present

## 2022-10-30 DIAGNOSIS — F419 Anxiety disorder, unspecified: Secondary | ICD-10-CM | POA: Diagnosis not present

## 2022-10-30 DIAGNOSIS — Z471 Aftercare following joint replacement surgery: Secondary | ICD-10-CM | POA: Diagnosis not present

## 2022-10-30 DIAGNOSIS — H3530 Unspecified macular degeneration: Secondary | ICD-10-CM | POA: Diagnosis not present

## 2022-10-30 DIAGNOSIS — F32A Depression, unspecified: Secondary | ICD-10-CM | POA: Diagnosis not present

## 2022-10-30 DIAGNOSIS — Z96641 Presence of right artificial hip joint: Secondary | ICD-10-CM | POA: Diagnosis not present

## 2022-11-01 DIAGNOSIS — Z96641 Presence of right artificial hip joint: Secondary | ICD-10-CM | POA: Diagnosis not present

## 2022-11-01 DIAGNOSIS — F419 Anxiety disorder, unspecified: Secondary | ICD-10-CM | POA: Diagnosis not present

## 2022-11-01 DIAGNOSIS — Z471 Aftercare following joint replacement surgery: Secondary | ICD-10-CM | POA: Diagnosis not present

## 2022-11-01 DIAGNOSIS — F32A Depression, unspecified: Secondary | ICD-10-CM | POA: Diagnosis not present

## 2022-11-01 DIAGNOSIS — H3530 Unspecified macular degeneration: Secondary | ICD-10-CM | POA: Diagnosis not present

## 2022-11-02 DIAGNOSIS — H3530 Unspecified macular degeneration: Secondary | ICD-10-CM | POA: Diagnosis not present

## 2022-11-02 DIAGNOSIS — Z471 Aftercare following joint replacement surgery: Secondary | ICD-10-CM | POA: Diagnosis not present

## 2022-11-02 DIAGNOSIS — F419 Anxiety disorder, unspecified: Secondary | ICD-10-CM | POA: Diagnosis not present

## 2022-11-02 DIAGNOSIS — Z96641 Presence of right artificial hip joint: Secondary | ICD-10-CM | POA: Diagnosis not present

## 2022-11-02 DIAGNOSIS — F32A Depression, unspecified: Secondary | ICD-10-CM | POA: Diagnosis not present

## 2022-11-05 ENCOUNTER — Ambulatory Visit (INDEPENDENT_AMBULATORY_CARE_PROVIDER_SITE_OTHER): Payer: Medicare PPO | Admitting: Orthopaedic Surgery

## 2022-11-05 ENCOUNTER — Encounter: Payer: Self-pay | Admitting: Orthopaedic Surgery

## 2022-11-05 DIAGNOSIS — Z23 Encounter for immunization: Secondary | ICD-10-CM | POA: Diagnosis not present

## 2022-11-05 DIAGNOSIS — Z96641 Presence of right artificial hip joint: Secondary | ICD-10-CM

## 2022-11-05 NOTE — Progress Notes (Signed)
The patient is here for first postoperative visit status post a right total hip arthroplasty.  She is only taking ibuprofen for pain.  We had removed cannulated screws from a separate incision and then proceed with a right anterior total hip.  She reports good range of motion and strength and is only ambulating with a cane.  Her right hip is moving smoothly and fluidly.  Both incisions look good and the staples are removed and Steri-Strips applied.  Her calf is soft.  She will continue to slowly increase her activities as comfort allows.  I would like to see her back in a month to see how she is doing from a mobility standpoint but no x-rays are needed.  If there are issues with her hip then we can certainly get x-rays if she has any other issues before then she knows to reach out and let us know.

## 2022-11-22 ENCOUNTER — Encounter: Payer: Self-pay | Admitting: Orthopaedic Surgery

## 2022-12-03 ENCOUNTER — Encounter: Payer: Self-pay | Admitting: Orthopaedic Surgery

## 2022-12-03 ENCOUNTER — Ambulatory Visit: Payer: Medicare PPO | Admitting: Orthopaedic Surgery

## 2022-12-03 DIAGNOSIS — Z96641 Presence of right artificial hip joint: Secondary | ICD-10-CM

## 2022-12-03 NOTE — Progress Notes (Signed)
The patient is now 6 weeks out from removal of cannulated screws from the right hip and then a right total hip arthroplasty.  She says she is doing well overall.  She has been having some left knee pain that is only been since surgery.  She points to the quad tendon area and the pes bursa area as a source of her left knee pain.  She occasionally does feel a deep pain with no injury that she is aware of the left knee.  Again it did not hurt before her surgery.  We did look at her right hip incisions.  There are 2 incisions since we did remove cannulated screws.  There is a little patchy area of redness in between his 2 incisions but it looks more like a contact dermatitis or something that would need to be treated with just hydrocortisone cream which I talked her about and want her to apply this area daily.  She can massage her incisions.  She will continue to increase her activities as comfort allows.  I did show her quad strengthening exercises to try and I recommended Voltaren gel as an over-the-counter topical anti-inflammatory for the left knee and showed her where to apply it.  We will see her back in 4 weeks to see how she is doing overall.  If she is still having left knee pain I would like x-rays of the left knee.  I would like a standing low AP pelvis at that next visit as well.

## 2023-01-07 ENCOUNTER — Ambulatory Visit: Payer: Medicare PPO | Admitting: Orthopaedic Surgery

## 2023-01-16 ENCOUNTER — Encounter: Payer: Self-pay | Admitting: Orthopaedic Surgery

## 2023-01-16 ENCOUNTER — Other Ambulatory Visit (INDEPENDENT_AMBULATORY_CARE_PROVIDER_SITE_OTHER): Payer: Self-pay

## 2023-01-16 ENCOUNTER — Ambulatory Visit: Payer: Medicare PPO | Admitting: Orthopaedic Surgery

## 2023-01-16 DIAGNOSIS — M25562 Pain in left knee: Secondary | ICD-10-CM

## 2023-01-16 DIAGNOSIS — Z96641 Presence of right artificial hip joint: Secondary | ICD-10-CM | POA: Diagnosis not present

## 2023-01-16 MED ORDER — METHYLPREDNISOLONE ACETATE 40 MG/ML IJ SUSP
40.0000 mg | INTRAMUSCULAR | Status: AC | PRN
  Administered 2023-01-16: 40 mg via INTRA_ARTICULAR

## 2023-01-16 MED ORDER — LIDOCAINE HCL 1 % IJ SOLN
3.0000 mL | INTRAMUSCULAR | Status: AC | PRN
  Administered 2023-01-16: 3 mL

## 2023-01-16 NOTE — Progress Notes (Signed)
 The patient is just under 3 months status post a right total hip arthroplasty.  We remove cannulated screws from the right hip and then replaced her hip.  She said the hip is doing great.  She has been having left knee pain though since surgery and she points to the medial aspect of her knee that hurts the most.  Has she been sitting for a while and there is a get up is where she experiences more the pain.  It does not hurt when she walks.  She has never had any injury to her left knee or surgery on the left knee.  Her right operative hip moves smoothly and fluidly with no issues of the hip at all.  Her right knee exam is normal.  Her left knee shows no effusion but there is some medial joint line tenderness and a slightly positive McMurray's sign to the medial compartment of the left knee.  There is no significant patellofemoral crepitation and her left knee is ligamentously stable with full motion.  X-rays of the left knee show to some slight medial joint space narrowing and slight patellofemoral narrowing.  There is only slight varus malalignment but overall no effusion and no acute findings.  X-rays of her pelvis and right hip show normal-appearing hip replacement with no complicating features.  I did recommend a one-time steroid injection in her left knee to see if this can be therapeutic for her and she agrees with this and tolerated it well.  If her knee is not getting better she knows to reach out to us  and let us  know and then we will see her back for her knee.  From a hip standpoint, we do not need to see her back for 6 months unless there are issues.  Will have a final standing AP pelvis at that visit.     Procedure Note  Patient: Hannah Contreras             Date of Birth: 1950-10-21           MRN: [id number]             Visit Date: 01/16/2023  Procedures: Visit Diagnoses:  1. Status post right hip replacement   2. Acute pain of left knee     Large Joint Inj: L knee on 01/16/2023  9:50 AM Indications: diagnostic evaluation and pain Details: 22 G 1.5 in needle, superolateral approach  Arthrogram: No  Medications: 3 mL lidocaine  1 %; 40 mg methylPREDNISolone  acetate 40 MG/ML Outcome: tolerated well, no immediate complications Procedure, treatment alternatives, risks and benefits explained, specific risks discussed. Consent was given by the patient. Immediately prior to procedure a time out was called to verify the correct patient, procedure, equipment, support staff and site/side marked as required. Patient was prepped and draped in the usual sterile fashion.

## 2023-01-22 DIAGNOSIS — Z79899 Other long term (current) drug therapy: Secondary | ICD-10-CM | POA: Diagnosis not present

## 2023-01-22 DIAGNOSIS — M1990 Unspecified osteoarthritis, unspecified site: Secondary | ICD-10-CM | POA: Diagnosis not present

## 2023-01-22 DIAGNOSIS — F329 Major depressive disorder, single episode, unspecified: Secondary | ICD-10-CM | POA: Diagnosis not present

## 2023-01-22 DIAGNOSIS — E785 Hyperlipidemia, unspecified: Secondary | ICD-10-CM | POA: Diagnosis not present

## 2023-01-28 DIAGNOSIS — Z0001 Encounter for general adult medical examination with abnormal findings: Secondary | ICD-10-CM | POA: Diagnosis not present

## 2023-01-28 DIAGNOSIS — E785 Hyperlipidemia, unspecified: Secondary | ICD-10-CM | POA: Diagnosis not present

## 2023-01-28 DIAGNOSIS — F325 Major depressive disorder, single episode, in full remission: Secondary | ICD-10-CM | POA: Diagnosis not present

## 2023-01-28 DIAGNOSIS — Z0000 Encounter for general adult medical examination without abnormal findings: Secondary | ICD-10-CM | POA: Diagnosis not present

## 2023-01-28 DIAGNOSIS — M1990 Unspecified osteoarthritis, unspecified site: Secondary | ICD-10-CM | POA: Diagnosis not present

## 2023-02-13 ENCOUNTER — Other Ambulatory Visit (HOSPITAL_COMMUNITY): Payer: Self-pay | Admitting: Internal Medicine

## 2023-02-13 DIAGNOSIS — Z1231 Encounter for screening mammogram for malignant neoplasm of breast: Secondary | ICD-10-CM

## 2023-03-21 DIAGNOSIS — D485 Neoplasm of uncertain behavior of skin: Secondary | ICD-10-CM | POA: Diagnosis not present

## 2023-03-21 DIAGNOSIS — L570 Actinic keratosis: Secondary | ICD-10-CM | POA: Diagnosis not present

## 2023-03-21 DIAGNOSIS — Z1283 Encounter for screening for malignant neoplasm of skin: Secondary | ICD-10-CM | POA: Diagnosis not present

## 2023-03-21 DIAGNOSIS — L820 Inflamed seborrheic keratosis: Secondary | ICD-10-CM | POA: Diagnosis not present

## 2023-03-21 DIAGNOSIS — L538 Other specified erythematous conditions: Secondary | ICD-10-CM | POA: Diagnosis not present

## 2023-03-21 DIAGNOSIS — L568 Other specified acute skin changes due to ultraviolet radiation: Secondary | ICD-10-CM | POA: Diagnosis not present

## 2023-03-21 DIAGNOSIS — L821 Other seborrheic keratosis: Secondary | ICD-10-CM | POA: Diagnosis not present

## 2023-04-01 ENCOUNTER — Ambulatory Visit (HOSPITAL_COMMUNITY): Payer: Medicare PPO

## 2023-04-11 ENCOUNTER — Ambulatory Visit (HOSPITAL_COMMUNITY)
Admission: RE | Admit: 2023-04-11 | Discharge: 2023-04-11 | Disposition: A | Discharge status: Home / Self Care | Source: Ambulatory Visit | Attending: Internal Medicine | Admitting: Internal Medicine

## 2023-04-11 DIAGNOSIS — Z1231 Encounter for screening mammogram for malignant neoplasm of breast: Secondary | ICD-10-CM | POA: Insufficient documentation

## 2023-04-11 DIAGNOSIS — H353222 Exudative age-related macular degeneration, left eye, with inactive choroidal neovascularization: Secondary | ICD-10-CM | POA: Diagnosis not present

## 2023-05-08 DIAGNOSIS — L570 Actinic keratosis: Secondary | ICD-10-CM | POA: Diagnosis not present

## 2023-05-08 DIAGNOSIS — L821 Other seborrheic keratosis: Secondary | ICD-10-CM | POA: Diagnosis not present

## 2023-07-24 ENCOUNTER — Other Ambulatory Visit (INDEPENDENT_AMBULATORY_CARE_PROVIDER_SITE_OTHER): Payer: Self-pay

## 2023-07-24 ENCOUNTER — Ambulatory Visit: Payer: Medicare PPO | Admitting: Physician Assistant

## 2023-07-24 ENCOUNTER — Encounter: Payer: Self-pay | Admitting: Orthopaedic Surgery

## 2023-07-24 DIAGNOSIS — Z96641 Presence of right artificial hip joint: Secondary | ICD-10-CM | POA: Diagnosis not present

## 2023-07-24 NOTE — Progress Notes (Signed)
 HPI: [name] is a 73 year old female comes in today for follow-up of her right total hip arthroplasty 10/23/2022.  She states that she has no pain in the hip whatsoever.  States she is doing well.  She notes that this surgery gave her life back.  She still having some discomfort in her left knee does not feel knee injection on 01/16/2023 gave her any significant relief.  Pain with transitioning from sitting to standing.  No mechanical symptoms.  She states knee does not bother her to the point that she would have anything done about it today.  Review of systems: See above HPI  Physical exam: General Well-developed well-nourished female no acute distress.  Ambulates without any assistive device.  Right hip: Good range of motion of the right hip without pain. Left knee: Good range of motion.  Patellofemoral crepitus.  No abnormal warmth erythema.  Radiographs: AP lateral view right hip: The right hip is well located.  Status post right total hip arthroplasty with good bony ingrowth.  No acute fractures or acute findings.  Impression: Status post right total hip arthroplasty 10/23/2022 Left knee OA  Plan: Recommend she work on quad strengthening exercises shown.  Discussed knee friendly exercises.  In regards to the right hip she can follow-up with us  as needed.  Questions were encouraged and answered at length.

## 2023-07-29 DIAGNOSIS — F3342 Major depressive disorder, recurrent, in full remission: Secondary | ICD-10-CM | POA: Diagnosis not present

## 2023-10-17 DIAGNOSIS — H353223 Exudative age-related macular degeneration, left eye, with inactive scar: Secondary | ICD-10-CM | POA: Diagnosis not present

## 2023-10-23 DIAGNOSIS — L820 Inflamed seborrheic keratosis: Secondary | ICD-10-CM | POA: Diagnosis not present

## 2023-10-23 DIAGNOSIS — L821 Other seborrheic keratosis: Secondary | ICD-10-CM | POA: Diagnosis not present

## 2023-10-23 DIAGNOSIS — Z23 Encounter for immunization: Secondary | ICD-10-CM | POA: Diagnosis not present

## 2023-10-23 DIAGNOSIS — L538 Other specified erythematous conditions: Secondary | ICD-10-CM | POA: Diagnosis not present

## 2023-10-23 DIAGNOSIS — D485 Neoplasm of uncertain behavior of skin: Secondary | ICD-10-CM | POA: Diagnosis not present

## 2023-10-23 DIAGNOSIS — L568 Other specified acute skin changes due to ultraviolet radiation: Secondary | ICD-10-CM | POA: Diagnosis not present

## 2023-11-04 ENCOUNTER — Encounter: Payer: Self-pay | Admitting: Radiology

## 2023-11-07 DIAGNOSIS — L820 Inflamed seborrheic keratosis: Secondary | ICD-10-CM | POA: Diagnosis not present

## 2023-11-07 DIAGNOSIS — L538 Other specified erythematous conditions: Secondary | ICD-10-CM | POA: Diagnosis not present

## 2023-11-07 DIAGNOSIS — C44622 Squamous cell carcinoma of skin of right upper limb, including shoulder: Secondary | ICD-10-CM | POA: Diagnosis not present

## 2023-11-07 DIAGNOSIS — L2989 Other pruritus: Secondary | ICD-10-CM | POA: Diagnosis not present

## 2023-11-07 DIAGNOSIS — L988 Other specified disorders of the skin and subcutaneous tissue: Secondary | ICD-10-CM | POA: Diagnosis not present

## 2023-11-21 DIAGNOSIS — L568 Other specified acute skin changes due to ultraviolet radiation: Secondary | ICD-10-CM | POA: Diagnosis not present

## 2023-11-21 DIAGNOSIS — L820 Inflamed seborrheic keratosis: Secondary | ICD-10-CM | POA: Diagnosis not present

## 2023-11-21 DIAGNOSIS — L821 Other seborrheic keratosis: Secondary | ICD-10-CM | POA: Diagnosis not present

## 2023-11-21 DIAGNOSIS — Z85828 Personal history of other malignant neoplasm of skin: Secondary | ICD-10-CM | POA: Diagnosis not present

## 2023-11-21 DIAGNOSIS — Z08 Encounter for follow-up examination after completed treatment for malignant neoplasm: Secondary | ICD-10-CM | POA: Diagnosis not present

## 2023-11-21 DIAGNOSIS — D485 Neoplasm of uncertain behavior of skin: Secondary | ICD-10-CM | POA: Diagnosis not present
